# Patient Record
Sex: Female | Born: 1950 | Race: Black or African American | Hispanic: No | Marital: Married | Smoking: Never smoker
Health system: Southern US, Community
[De-identification: ages and names within clinical notes are randomized; demographics above are authoritative.]

## PROBLEM LIST (undated history)

## (undated) DIAGNOSIS — H409 Unspecified glaucoma: Secondary | ICD-10-CM

## (undated) DIAGNOSIS — K219 Gastro-esophageal reflux disease without esophagitis: Secondary | ICD-10-CM

## (undated) DIAGNOSIS — E785 Hyperlipidemia, unspecified: Secondary | ICD-10-CM

## (undated) DIAGNOSIS — D649 Anemia, unspecified: Secondary | ICD-10-CM

## (undated) DIAGNOSIS — E039 Hypothyroidism, unspecified: Secondary | ICD-10-CM

## (undated) DIAGNOSIS — E119 Type 2 diabetes mellitus without complications: Secondary | ICD-10-CM

## (undated) DIAGNOSIS — G4730 Sleep apnea, unspecified: Secondary | ICD-10-CM

## (undated) HISTORY — DX: Hyperlipidemia, unspecified: E78.5

## (undated) HISTORY — DX: Sleep apnea, unspecified: G47.30

## (undated) HISTORY — DX: Anemia, unspecified: D64.9

## (undated) HISTORY — DX: Unspecified glaucoma: H40.9

## (undated) HISTORY — PX: TUBAL LIGATION: SHX77

## (undated) HISTORY — DX: Type 2 diabetes mellitus without complications: E11.9

## (undated) HISTORY — PX: VAGINAL HYSTERECTOMY: SUR661

## (undated) HISTORY — DX: Hypothyroidism, unspecified: E03.9

## (undated) HISTORY — DX: Gastro-esophageal reflux disease without esophagitis: K21.9

---

## 2003-10-24 ENCOUNTER — Ambulatory Visit: Payer: Self-pay

## 2004-11-15 ENCOUNTER — Ambulatory Visit: Payer: Self-pay

## 2005-05-26 ENCOUNTER — Encounter: Payer: Self-pay | Admitting: Physician Assistant

## 2005-06-10 ENCOUNTER — Encounter: Payer: Self-pay | Admitting: Physician Assistant

## 2005-11-28 ENCOUNTER — Ambulatory Visit: Payer: Self-pay

## 2006-12-21 ENCOUNTER — Ambulatory Visit: Payer: Self-pay

## 2009-02-10 ENCOUNTER — Ambulatory Visit: Payer: Self-pay

## 2010-01-20 ENCOUNTER — Ambulatory Visit: Payer: Self-pay

## 2014-10-24 ENCOUNTER — Ambulatory Visit (INDEPENDENT_AMBULATORY_CARE_PROVIDER_SITE_OTHER): Payer: Medicare Other | Admitting: "Endocrinology

## 2014-10-24 ENCOUNTER — Encounter: Payer: Self-pay | Admitting: "Endocrinology

## 2014-10-24 VITALS — BP 146/85 | HR 74 | Ht 64.0 in | Wt 154.0 lb

## 2014-10-24 DIAGNOSIS — E079 Disorder of thyroid, unspecified: Secondary | ICD-10-CM

## 2014-10-24 DIAGNOSIS — E119 Type 2 diabetes mellitus without complications: Secondary | ICD-10-CM

## 2014-10-24 NOTE — Progress Notes (Signed)
HPI  Erin Chen is a 64 y.o.-year-old female, referred by her PCP, Dr.Sabitha Vasiredy, for management of hypothyroidism.  Pt. has been dx with hypothyroidism  3 months ago; she was initiated on levothyroxine 50 g which she states did not tolerate, causing skin rash. He was then switched to Synthroid which also caused skin rash. However when she was taking Synthroid she was also taking 2 other medications including statin. Her last TSH was elevated at 4.6 2-3 weeks after her last thyroid hormone. -She describes weight gain, fatigue, cold intolerance, and constipation. She denies heat intolerance palpitations nor weight loss.  She denies family history of thyroid dysfunction. She denies personal history of goiter. She denies family history of thyroid cancer. She denies history of antithyroid therapy nor radiation to the neck.     ROS: Constitutional: + weight gain/loss, + fatigue, no subjective hyperthermia/hypothermia Eyes: no blurry vision, no xerophthalmia ENT: no sore throat, no nodules palpated in throat, no dysphagia/odynophagia, no hoarseness Cardiovascular: no CP/SOB/palpitations/leg swelling Respiratory: no cough/SOB Gastrointestinal: no N/V/D/C Musculoskeletal: no muscle/joint aches Skin: no rashes Neurological: no tremors/numbness/tingling/dizziness Psychiatric: no depression/anxiety  PE: BP 146/85 mmHg  Pulse 74  Ht 5\' 4"  (1.626 m)  Wt 154 lb (69.854 kg)  BMI 26.42 kg/m2  SpO2 98% Wt Readings from Last 3 Encounters:  10/24/14 154 lb (69.854 kg)   Constitutional: overweight, in NAD Eyes: PERRLA, EOMI, no exophthalmos ENT: moist mucous membranes, no thyromegaly, no cervical lymphadenopathy Cardiovascular: RRR, No MRG Respiratory: CTA B Gastrointestinal: abdomen soft, NT, ND, BS+ Musculoskeletal: no deformities, strength intact in all 4 Skin: moist, warm, no rashes Neurological: no tremor with outstretched hands, DTR normal in all 4   ASSESSMENT: 1.  Hypothyroidism 2. Type 2 diabetes with A1c 6.9%. PLAN:    Patient with hypothyroidism reported intolerance to thyroid hormone.  Her most recent TSH is above target at 4.6. She will need at least partial thyroid hormone supplement. I will obtain thyroid hormone, TSH, and antithyroid antibodies today euthyroid. On physical exam , patient  does not appear to have a goiter, thyroid nodules, or neck compression symptoms. -I have advised her to eat 3 meals a day, avoid unnecessary snacks, and avoid processed carbohydrates including soda, ice cream, and ice tea to help her control type 2 diabetes. She is hesitant to go on medications. -She will return in 1 week for treatment decision.   Marquis LunchGebre Orhan Mayorga, MD Phone: [phone]  Fax: [phone]   10/24/2014, 8:01 PM

## 2014-10-24 NOTE — Patient Instructions (Signed)

## 2014-10-25 LAB — THYROGLOBULIN ANTIBODY: Thyroglobulin Ab: <1 IU/mL (ref <2)

## 2014-10-25 LAB — T4, FREE: Free T4: 0.73 ng/dL — ABNORMAL LOW (ref 0.80–1.80)

## 2014-10-25 LAB — TSH: TSH: 3.011 u[IU]/mL (ref 0.350–4.500)

## 2014-10-25 LAB — THYROID PEROXIDASE ANTIBODY

## 2014-10-29 ENCOUNTER — Ambulatory Visit: Payer: Self-pay | Admitting: "Endocrinology

## 2014-10-31 ENCOUNTER — Ambulatory Visit (INDEPENDENT_AMBULATORY_CARE_PROVIDER_SITE_OTHER): Payer: Medicare Other | Admitting: "Endocrinology

## 2014-10-31 ENCOUNTER — Encounter: Payer: Self-pay | Admitting: "Endocrinology

## 2014-10-31 VITALS — BP 129/81 | HR 79 | Ht 64.0 in | Wt 156.0 lb

## 2014-10-31 DIAGNOSIS — E119 Type 2 diabetes mellitus without complications: Secondary | ICD-10-CM

## 2014-10-31 DIAGNOSIS — E039 Hypothyroidism, unspecified: Secondary | ICD-10-CM | POA: Diagnosis not present

## 2014-10-31 MED ORDER — LEVOTHYROXINE SODIUM 25 MCG PO TABS: 25.0000 ug | ORAL_TABLET | Freq: Every day | ORAL | Status: DC

## 2014-10-31 NOTE — Patient Instructions (Signed)

## 2014-11-01 NOTE — Progress Notes (Signed)
HPI  Erin Chen is a 64 y.o.-year-old female,here to follow up after new set of thyroid function tests. She has hx of hypothyroidism , not on therapy.  Pt. has been dx with hypothyroidism  3 months ago; she was initiated on levothyroxine 50 g which she states did not tolerate, causing skin rash.    -She describes weight gain, fatigue, cold intolerance, and constipation. She denies heat intolerance palpitations nor weight loss.  She denies family history of thyroid dysfunction. She denies personal history of goiter. She denies family history of thyroid cancer. She denies history of antithyroid therapy nor radiation to the neck.     ROS: Constitutional: + weight gain/loss, + fatigue, no subjective hyperthermia/hypothermia Eyes: no blurry vision, no xerophthalmia ENT: no sore throat, no nodules palpated in throat, no dysphagia/odynophagia, no hoarseness Cardiovascular: no CP/SOB/palpitations/leg swelling Respiratory: no cough/SOB Gastrointestinal: no N/V/D/C Musculoskeletal: no muscle/joint aches Skin: no rashes Neurological: no tremors/numbness/tingling/dizziness Psychiatric: no depression/anxiety  PE: BP 129/81 mmHg  Pulse 79  Ht 5\' 4"  (1.626 m)  Wt 156 lb (70.761 kg)  BMI 26.76 kg/m2  SpO2 99% Wt Readings from Last 3 Encounters:  10/31/14 156 lb (70.761 kg)  10/24/14 154 lb (69.854 kg)   Constitutional: overweight, in NAD Eyes: PERRLA, EOMI, no exophthalmos ENT: moist mucous membranes, no thyromegaly, no cervical lymphadenopathy Cardiovascular: RRR, No MRG Respiratory: CTA B Gastrointestinal: abdomen soft, NT, ND, BS+ Musculoskeletal: no deformities, strength intact in all 4 Skin: moist, warm, no rashes Neurological: no tremor with outstretched hands, DTR normal in all 4   Results for Erin GalesJONES, Emika H (MRN [id number]) as of 11/01/2014 03:36  Ref. Range 10/24/2014 15:35  TSH Latest Ref Range: 0.350-4.500 uIU/mL 3.011  Free T4 Latest Ref Range: 0.80-1.80 ng/dL  4.090.73 (L)  Thyroglobulin Ab Latest Ref Range: <2 IU/mL <1  Thyroperoxidase Ab SerPl-aCnc Latest Ref Range: <9 IU/mL >900 (H)    ASSESSMENT: 1. 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 2. Type 2 diabetes with A1c 6.9%.   PLAN:  - options of thyroid hormone replacement were discussed with her. She agrees to try low dose Synthroid for now. I will prescribe 25 mcg po qam, to advance as tolerated.    - We discussed about correct intake of levothyroxine, at fasting, with water, separated by at least 30 minutes from breakfast, and separated by more than 4 hours from calcium, iron, multivitamins, acid reflux medications (PPIs). -Patient is made aware of the fact that thyroid hormone replacement is needed for life, dose to be adjusted by periodic monitoring of thyroid function tests.  Regarding her controlled type 2 DM:  -I have advised her to eat 3 meals a day, avoid unnecessary snacks, and avoid processed carbohydrates including soda, ice cream, and ice tea to help her control type 2 diabetes. She is hesitant to go on medications.  -She will return in 12 weeks with repeat labs for reevaluation.  Marquis LunchGebre Rakia Frayne, MD Phone: [phone]  Fax: [phone]   11/01/2014, 3:34 AM

## 2014-12-01 ENCOUNTER — Telehealth: Payer: Self-pay

## 2014-12-01 ENCOUNTER — Other Ambulatory Visit: Payer: Self-pay | Admitting: "Endocrinology

## 2014-12-01 MED ORDER — LEVOTHYROXINE SODIUM 13 MCG PO CAPS: 13.0000 ug | ORAL_CAPSULE | Freq: Every day | ORAL | Status: DC

## 2014-12-01 NOTE — Telephone Encounter (Signed)
Her next option is Tirosint , which may be more expensive than Synthroid. I will send in a prescription for her to replace that Synthroid with.

## 2014-12-01 NOTE — Telephone Encounter (Signed)
Pt states that the synthroid is still causing her to itch. She states she discussed this with Dr Fransico HimNida at her last visit and wants to know what else she can take.

## 2014-12-02 ENCOUNTER — Other Ambulatory Visit: Payer: Self-pay

## 2014-12-02 NOTE — Telephone Encounter (Signed)
Pt notified and agrees. 

## 2014-12-02 NOTE — Telephone Encounter (Signed)
Pt states she will call her PC about that but she is still having itching with the Levothyroxine and wants to know if she should continue on this medication.

## 2015-01-29 ENCOUNTER — Other Ambulatory Visit: Payer: Self-pay | Admitting: "Endocrinology

## 2015-01-30 LAB — HEMOGLOBIN A1C
HEMOGLOBIN A1C: 6.9 % — AB (ref <5.7)
Mean Plasma Glucose: 151 mg/dL — ABNORMAL HIGH (ref <117)

## 2015-01-30 LAB — T4, FREE: FREE T4: 0.81 ng/dL (ref 0.80–1.80)

## 2015-01-30 LAB — TSH: TSH: 2.854 u[IU]/mL (ref 0.350–4.500)

## 2015-02-05 ENCOUNTER — Ambulatory Visit: Payer: Medicare Other | Admitting: "Endocrinology

## 2015-02-06 ENCOUNTER — Ambulatory Visit (INDEPENDENT_AMBULATORY_CARE_PROVIDER_SITE_OTHER): Payer: Medicare Other | Admitting: "Endocrinology

## 2015-02-06 ENCOUNTER — Encounter: Payer: Self-pay | Admitting: "Endocrinology

## 2015-02-06 VITALS — BP 135/82 | HR 64 | Ht 64.0 in | Wt 154.0 lb

## 2015-02-06 DIAGNOSIS — E119 Type 2 diabetes mellitus without complications: Secondary | ICD-10-CM

## 2015-02-06 DIAGNOSIS — E785 Hyperlipidemia, unspecified: Secondary | ICD-10-CM

## 2015-02-06 DIAGNOSIS — E039 Hypothyroidism, unspecified: Secondary | ICD-10-CM | POA: Diagnosis not present

## 2015-02-06 DIAGNOSIS — E782 Mixed hyperlipidemia: Secondary | ICD-10-CM | POA: Insufficient documentation

## 2015-02-06 MED ORDER — LEVOTHYROXINE SODIUM 25 MCG PO CAPS: 1.0000 ug | ORAL_CAPSULE | Freq: Every day | ORAL | Status: DC

## 2015-02-06 NOTE — Progress Notes (Signed)
HPI  Erin Chen is a 65 y.o.-year-old female,here to follow up after new set of thyroid function tests. She has hx of hypothyroidism ,  on therapy on 13 g of Tirosint due to intolerance to levothyroxine and Synthroid. -She has tolerated this medication better. Pt. has been dx with hypothyroidism  6 months ago. -She has no new complaints. -She has dealt with weight gain, fatigue, cold intolerance, and constipation. She denies heat intolerance palpitations nor weight loss.  She denies family history of thyroid dysfunction. She denies personal history of goiter. She denies family history of thyroid cancer. She denies history of antithyroid therapy nor radiation to the neck.     Medication List       This list is accurate as of: 02/06/15  3:09 PM.  Always use your most recent med list.               Fish Oil 1000 MG Caps  Take 2,000 mg by mouth daily.     Levothyroxine Sodium 25 MCG Caps  Take 0.04 capsules (1 mcg total) by mouth daily before breakfast.     simvastatin 20 MG tablet  Commonly known as:  ZOCOR  Take 20 mg by mouth daily.     Vitamin D3 2000 units Tabs  Take by mouth daily.        ROS: Constitutional: + weight gain/loss, + fatigue, no subjective hyperthermia/hypothermia Eyes: no blurry vision, no xerophthalmia ENT: no sore throat, no nodules palpated in throat, no dysphagia/odynophagia, no hoarseness Cardiovascular: no CP/SOB/palpitations/leg swelling Respiratory: no cough/SOB Gastrointestinal: no N/V/D/C Musculoskeletal: no muscle/joint aches Skin: no rashes Neurological: no tremors/numbness/tingling/dizziness Psychiatric: no depression/anxiety  PE: BP 135/82 mmHg  Pulse 64  Ht  (1.626 m)  Wt 154 lb (69.854 kg)  BMI 26.42 kg/m2  SpO2 98% Wt Readings from Last 3 Encounters:  02/06/15 154 lb (69.854 kg)  10/31/14 156 lb (70.761 kg)  10/24/14 154 lb (69.854 kg)   Constitutional: overweight, in NAD Eyes: PERRLA, EOMI, no exophthalmos ENT:  moist mucous membranes, no thyromegaly, no cervical lymphadenopathy Cardiovascular: RRR, No MRG Respiratory: CTA B Gastrointestinal: abdomen soft, NT, ND, BS+ Musculoskeletal: no deformities, strength intact in all 4 Skin: moist, warm, no rashes Neurological: no tremor with outstretched hands, DTR normal in all 4   Recent Results (from the past 2160 hour(s))  TSH     Status: None   Collection Time: 01/29/15  1:38 PM  Result Value Ref Range   TSH 2.854 0.350 - 4.500 uIU/mL  T4, free     Status: None   Collection Time: 01/29/15  1:38 PM  Result Value Ref Range   Free T4 0.81 0.80 - 1.80 ng/dL  Hemoglobin W1X     Status: Abnormal   Collection Time: 01/29/15  1:38 PM  Result Value Ref Range   Hgb A1c MFr Bld 6.9 (H) <5.7 %    Comment:                                                                        According to the ADA Clinical Practice Recommendations for 2011, when HbA1c is used as a screening test:     >=6.5%   Diagnostic of Diabetes Mellitus            (  if abnormal result is confirmed)   5.7-6.4%   Increased risk of developing Diabetes Mellitus   References:Diagnosis and Classification of Diabetes Mellitus,Diabetes Care,2011,34(Suppl 1):S62-S69 and Standards of Medical Care in         Diabetes - 2011,Diabetes Care,2011,34 (Suppl 1):S11-S61.      Mean Plasma Glucose 151 (H) <117 mg/dL     ASSESSMENT: 1. 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 2. Type 2 diabetes with A1c 6.9%. 3. Hyperlipidemia 4. Vitamin D deficiency  PLAN:  -She will benefit from slight increase in her Tirosint. I would increase to 25 g by mouth every morning.   - We discussed about correct intake of levothyroxine, at fasting, with water, separated by at least 30 minutes from breakfast, and separated by more than 4 hours from calcium, iron, multivitamins, acid reflux medications (PPIs). -Patient is made aware of the fact that thyroid hormone replacement is needed for life, dose to be  adjusted by periodic monitoring of thyroid function tests.  Regarding her controlled type 2 DM:  -I have advised her to eat 3 meals a day, avoid unnecessary snacks, and avoid processed carbohydrates including soda, ice cream, and ice tea to help her control type 2 diabetes. She is hesitant to go on medications. A1c 6.9%.   -I advised her to continue simvastatin 20 mg by mouth daily at bedtime. -I advised her to continue vitamin D 2000 units daily. -She will return in 16 weeks with repeat labs for reevaluation.  [name], MD Phone: [phone]  Fax: [phone]   02/06/2015, 3:04 PM

## 2015-03-21 ENCOUNTER — Other Ambulatory Visit: Payer: Self-pay | Admitting: "Endocrinology

## 2015-06-11 ENCOUNTER — Encounter: Payer: Self-pay | Admitting: "Endocrinology

## 2015-06-11 ENCOUNTER — Ambulatory Visit (INDEPENDENT_AMBULATORY_CARE_PROVIDER_SITE_OTHER): Payer: Medicare Other | Admitting: "Endocrinology

## 2015-06-11 VITALS — BP 127/77 | HR 74 | Ht 64.0 in | Wt 152.0 lb

## 2015-06-11 DIAGNOSIS — E785 Hyperlipidemia, unspecified: Secondary | ICD-10-CM

## 2015-06-11 DIAGNOSIS — E119 Type 2 diabetes mellitus without complications: Secondary | ICD-10-CM | POA: Diagnosis not present

## 2015-06-11 DIAGNOSIS — E039 Hypothyroidism, unspecified: Secondary | ICD-10-CM

## 2015-06-11 NOTE — Progress Notes (Signed)
HPI  Erin Chen is a 65 y.o.-year-old female,here to follow up for hypothyroidism and type 2 DM.  She has hx of hypothyroidism ,  on therapy on 25 g of Tirosint due to intolerance to levothyroxine and Synthroid. -She has tolerated this medication better. -She has no new complaints. -She has steady weight, mild fatigue. She denies cold intolerance/ constipation. She denies heat intolerance palpitations nor weight loss.  She denies family history of thyroid dysfunction. She denies personal history of goiter. She denies family history of thyroid cancer. She denies history of antithyroid therapy nor radiation to the neck.     Medication List       This list is accurate as of: 06/11/15  1:55 PM.  Always use your most recent med list.               Fish Oil 1000 MG Caps  Take 2,000 mg by mouth daily.     Levothyroxine Sodium 25 MCG Caps  Take 0.04 capsules (1 mcg total) by mouth daily before breakfast.     simvastatin 20 MG tablet  Commonly known as:  ZOCOR  Take 20 mg by mouth daily.     Vitamin D3 2000 units Tabs  Take by mouth daily.        ROS: Constitutional: steady weight  + fatigue, no subjective hyperthermia/hypothermia Eyes: no blurry vision, no xerophthalmia ENT: no sore throat, no nodules palpated in throat, no dysphagia/odynophagia, no hoarseness Cardiovascular: no CP/SOB/palpitations/leg swelling Respiratory: no cough/SOB Gastrointestinal: no N/V/D/C Musculoskeletal: no muscle/joint aches Skin: no rashes Neurological: no tremors/numbness/tingling/dizziness Psychiatric: no depression/anxiety  PE: BP 127/77 mmHg  Pulse 74  Ht 5\' 4"  (1.626 m)  Wt 152 lb (68.947 kg)  BMI 26.08 kg/m2  SpO2 95% Wt Readings from Last 3 Encounters:  06/11/15 152 lb (68.947 kg)  02/06/15 154 lb (69.854 kg)  10/31/14 156 lb (70.761 kg)   Constitutional:  in NAD Eyes: PERRLA, EOMI, no exophthalmos ENT: moist mucous membranes, no thyromegaly, no cervical  lymphadenopathy Cardiovascular: RRR, No MRG Respiratory: CTA B Gastrointestinal: abdomen soft, NT, ND, BS+ Musculoskeletal: no deformities, strength intact in all 4 Skin: moist, warm, no rashes Neurological: no tremor with outstretched hands, DTR normal in all 4    Erin recent labs did not include thyroid function test.  Erin A1c is better at 6.6%   ASSESSMENT: 1. 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 2. Type 2 diabetes with A1c 6.6% improving from 6.9%. 3. Hyperlipidemia 4. Vitamin D deficiency  PLAN:  -She will continue on  Tirosint 25 g by mouth every morning.   - We discussed about correct intake of levothyroxine, at fasting, with water, separated by at least 30 minutes from breakfast, and separated by more than 4 hours from calcium, iron, multivitamins, acid reflux medications (PPIs). -Patient is made aware of the fact that thyroid hormone replacement is needed for life, dose to be adjusted by periodic monitoring of thyroid function tests.  Regarding Erin controlled type 2 DM:  -I have advised Erin to eat 3 meals a day, avoid unnecessary snacks, and avoid processed carbohydrates including soda, ice cream, and ice tea to help Erin control type 2 diabetes. She is hesitant to go on medications. A1c 6.6%.   -I advised Erin to continue simvastatin 20 mg by mouth daily at bedtime. -I advised Erin to continue vitamin D 2000 units daily. -She will return in 16 weeks with repeat labs for reevaluation.  Erin LunchGebre Erin Bernier, MD Phone: [phone]  Fax: [phone]   06/11/2015,  1:55 PM

## 2015-07-01 ENCOUNTER — Other Ambulatory Visit: Payer: Self-pay | Admitting: "Endocrinology

## 2015-08-14 ENCOUNTER — Other Ambulatory Visit: Payer: Self-pay | Admitting: "Endocrinology

## 2015-08-20 ENCOUNTER — Other Ambulatory Visit: Payer: Self-pay

## 2015-08-20 ENCOUNTER — Other Ambulatory Visit: Payer: Self-pay | Admitting: "Endocrinology

## 2015-08-20 MED ORDER — LEVOTHYROXINE SODIUM 25 MCG PO CAPS: ORAL_CAPSULE | ORAL | 2 refills | Status: DC

## 2015-11-18 ENCOUNTER — Other Ambulatory Visit: Payer: Self-pay | Admitting: "Endocrinology

## 2015-12-17 ENCOUNTER — Ambulatory Visit (INDEPENDENT_AMBULATORY_CARE_PROVIDER_SITE_OTHER): Payer: Medicare Other | Admitting: "Endocrinology

## 2015-12-17 ENCOUNTER — Encounter: Payer: Self-pay | Admitting: "Endocrinology

## 2015-12-17 VITALS — BP 127/80 | HR 82 | Ht 64.0 in | Wt 153.0 lb

## 2015-12-17 DIAGNOSIS — E782 Mixed hyperlipidemia: Secondary | ICD-10-CM | POA: Diagnosis not present

## 2015-12-17 DIAGNOSIS — E039 Hypothyroidism, unspecified: Secondary | ICD-10-CM

## 2015-12-17 DIAGNOSIS — E119 Type 2 diabetes mellitus without complications: Secondary | ICD-10-CM

## 2015-12-17 MED ORDER — TIROSINT 50 MCG PO CAPS: 50.0000 ug | ORAL_CAPSULE | Freq: Every day | ORAL | 6 refills | Status: DC

## 2015-12-17 NOTE — Progress Notes (Signed)
HPI  Erin Chen is a 65 y.o.-year-old female,here to follow up for hypothyroidism and type 2 DM.  She has hx of hypothyroidism ,  on therapy on 25 g of Tirosint due to intolerance to levothyroxine and Synthroid. -She has tolerated this medication better. -She has no new complaints. -She has steady weight, mild fatigue. She denies cold intolerance/ constipation. She denies heat intolerance palpitations nor weight loss.  She denies family history of thyroid dysfunction. She denies personal history of goiter. She denies family history of thyroid cancer. She denies history of antithyroid therapy nor radiation to the neck.     Medication List       Accurate as of 12/17/15  1:48 PM. Always use your most recent med list.          Fish Oil 1000 MG Caps Take 2,000 mg by mouth daily.   simvastatin 20 MG tablet Commonly known as:  ZOCOR Take 20 mg by mouth daily.   TIROSINT 50 MCG Caps Generic drug:  Levothyroxine Sodium Take 1 capsule (50 mcg total) by mouth daily before breakfast.   Vitamin D3 2000 units Tabs Take by mouth daily.       ROS: Constitutional: steady weight  + fatigue, no subjective hyperthermia/hypothermia Eyes: no blurry vision, no xerophthalmia ENT: no sore throat, no nodules palpated in throat, no dysphagia/odynophagia, no hoarseness Cardiovascular: no CP/SOB/palpitations/leg swelling Respiratory: no cough/SOB Gastrointestinal: no N/V/D/C Musculoskeletal: no muscle/joint aches Skin: no rashes Neurological: no tremors/numbness/tingling/dizziness Psychiatric: no depression/anxiety  PE: BP 127/80   Pulse 82   Ht 5\' 4"  (1.626 m)   Wt 153 lb (69.4 kg)   BMI 26.26 kg/m  Wt Readings from Last 3 Encounters:  12/17/15 153 lb (69.4 kg)  06/11/15 152 lb (68.9 kg)  02/06/15 154 lb (69.9 kg)   Constitutional:  in NAD Eyes: PERRLA, EOMI, no exophthalmos ENT: moist mucous membranes, no thyromegaly, no cervical lymphadenopathy Cardiovascular: RRR, No  MRG Respiratory: CTA B Gastrointestinal: abdomen soft, NT, ND, BS+ Musculoskeletal: no deformities, strength intact in all 4 Skin: moist, warm, no rashes Neurological: no tremor with outstretched hands, DTR normal in all 4    her recent labs did not include thyroid function test.  Her A1c is better at 6.6%   Her labs from 12/09/2015 showed free T4 low at 0.67 and TSH above target 7.31. Her repeat A1c is 6.6%.  ASSESSMENT: 1. 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 2. Type 2 diabetes with A1c 6.6% improving from 6.9%. 3. Hyperlipidemia 4. Vitamin D deficiency  PLAN:  -She will benefit from slight increase in her Tirosint, I will prescribe 25 g by mouth every morning.   - We discussed about correct intake of levothyroxine, at fasting, with water, separated by at least 30 minutes from breakfast, and separated by more than 4 hours from calcium, iron, multivitamins, acid reflux medications (PPIs). -Patient is made aware of the fact that thyroid hormone replacement is needed for life, dose to be adjusted by periodic monitoring of thyroid function tests.  Regarding her controlled type 2 DM:  -I have advised her to eat 3 meals a day, avoid unnecessary snacks, and avoid processed carbohydrates including soda, ice cream, and ice tea to help her control type 2 diabetes. She is hesitant to go on medications. A1c 6.6%.   -I advised her to continue simvastatin 20 mg by mouth daily at bedtime. Her LDL has improved to 60. -I advised her to continue vitamin D 2000 units daily. -She will return in 16 weeks  with repeat labs for reevaluation.  Marquis LunchGebre Mouhamadou Gittleman, MD Phone: [phone]  Fax: [phone]   12/17/2015, 1:48 PM

## 2016-06-16 ENCOUNTER — Ambulatory Visit (INDEPENDENT_AMBULATORY_CARE_PROVIDER_SITE_OTHER): Payer: Medicare Other | Admitting: "Endocrinology

## 2016-06-16 ENCOUNTER — Encounter: Payer: Self-pay | Admitting: "Endocrinology

## 2016-06-16 VITALS — BP 118/70 | HR 86 | Ht 64.0 in | Wt 153.0 lb

## 2016-06-16 DIAGNOSIS — E039 Hypothyroidism, unspecified: Secondary | ICD-10-CM | POA: Diagnosis not present

## 2016-06-16 DIAGNOSIS — E782 Mixed hyperlipidemia: Secondary | ICD-10-CM | POA: Diagnosis not present

## 2016-06-16 DIAGNOSIS — E119 Type 2 diabetes mellitus without complications: Secondary | ICD-10-CM | POA: Diagnosis not present

## 2016-06-16 MED ORDER — LEVOTHYROXINE SODIUM 75 MCG PO CAPS: 75.0000 ug | ORAL_CAPSULE | Freq: Every day | ORAL | 6 refills | Status: DC

## 2016-06-16 NOTE — Progress Notes (Signed)
HPI  Erin Chen is a 66 y.o.-year-old female,here to follow up for hypothyroidism and type 2 DM.  She has hx of hypothyroidism ,  on therapy on 50 g of Tirosint due to intolerance to levothyroxine and Synthroid. -She has tolerated this medication better. -She has no new complaints. -She has steady weight, mild fatigue. She denies cold intolerance/ constipation. She denies heat intolerance palpitations nor weight loss.  She denies family history of thyroid dysfunction. She denies personal history of goiter. She denies family history of thyroid cancer. She denies history of antithyroid therapy nor radiation to the neck.   Allergies as of 06/16/2016      Reactions   Levothyroxine    Synthroid [levothyroxine Sodium]       Medication List       Accurate as of 06/16/16  2:03 PM. Always use your most recent med list.          Fish Oil 1000 MG Caps Take 2,000 mg by mouth daily.   Levothyroxine Sodium 75 MCG Caps Commonly known as:  TIROSINT Take 1 capsule (75 mcg total) by mouth daily before breakfast.   simvastatin 20 MG tablet Commonly known as:  ZOCOR Take 20 mg by mouth daily.   Vitamin D3 2000 units Tabs Take by mouth daily.       ROS: Constitutional: steady weight  + fatigue, no subjective hyperthermia/hypothermia Eyes: no blurry vision, no xerophthalmia ENT: no sore throat, no nodules palpated in throat, no dysphagia/odynophagia, no hoarseness Cardiovascular: no CP/SOB/palpitations/leg swelling Respiratory: no cough/SOB Gastrointestinal: no N/V/D/C Musculoskeletal: no muscle/joint aches Skin: no rashes Neurological: no tremors/numbness/tingling/dizziness Psychiatric: no depression/anxiety  PE: BP 118/70   Pulse 86   Ht 5\' 4"  (1.626 m)   Wt 153 lb (69.4 kg)   BMI 26.26 kg/m  Wt Readings from Last 3 Encounters:  06/16/16 153 lb (69.4 kg)  12/17/15 153 lb (69.4 kg)  06/11/15 152 lb (68.9 kg)   Constitutional:  in NAD Eyes: PERRLA, EOMI, no  exophthalmos ENT: moist mucous membranes, no thyromegaly, no cervical lymphadenopathy Cardiovascular: RRR, No MRG Respiratory: CTA B Gastrointestinal: abdomen soft, NT, ND, BS+ Musculoskeletal: no deformities, strength intact in all 4 Skin: moist, warm, no rashes Neurological: no tremor with outstretched hands, DTR normal in all 4    her recent labs did not include thyroid function test.  Her A1c is better at 6.6%   Her labs from 12/09/2015 showed free T4 low at 0.67 and TSH above target 7.31. Her repeat A1c is 6.6%.   Her labs from the 06/10/2016 showed free T4 0.81 (normal 0.7 6-1.46), TSH 3.89 elevated (normal 0.3 5-3.74)   ASSESSMENT: 1. 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 2. Type 2 diabetes with A1c 6.6% improving from 6.9%. 3. Hyperlipidemia 4. Vitamin D deficiency  PLAN:  -She will benefit from slight increase in her Tirosint, I will prescribe  Tirosint 75 g by mouth every morning.   - We discussed about correct intake of levothyroxine, at fasting, with water, separated by at least 30 minutes from breakfast, and separated by more than 4 hours from calcium, iron, multivitamins, acid reflux medications (PPIs). -Patient is made aware of the fact that thyroid hormone replacement is needed for life, dose to be adjusted by periodic monitoring of thyroid function tests.  Regarding her controlled type 2 DM:  -I have advised her to eat 3 meals a day, avoid unnecessary snacks, and avoid processed carbohydrates including soda, ice cream, and ice tea to help her control type 2  diabetes. She is hesitant to go on medications. A1c 6.6%.   -I advised her to continue simvastatin 20 mg by mouth daily at bedtime. Her LDL has improved to 60. -I advised her to continue vitamin D 2000 units daily. - She will return in 6 months with labs for reevaluation.  Marquis LunchGebre Jadriel Saxer, MD Phone: [phone]  Fax: [phone]   06/16/2016, 2:03 PM

## 2016-12-13 LAB — TSH: TSH: 1.32 (ref <=5.90)

## 2016-12-13 LAB — HEMOGLOBIN A1C: HEMOGLOBIN A1C: 7.1

## 2016-12-19 ENCOUNTER — Ambulatory Visit: Payer: Medicare Other | Admitting: "Endocrinology

## 2016-12-21 ENCOUNTER — Ambulatory Visit (INDEPENDENT_AMBULATORY_CARE_PROVIDER_SITE_OTHER): Payer: Medicare Other | Admitting: "Endocrinology

## 2016-12-21 ENCOUNTER — Encounter: Payer: Self-pay | Admitting: "Endocrinology

## 2016-12-21 VITALS — BP 129/79 | HR 79 | Ht 64.0 in | Wt 156.0 lb

## 2016-12-21 DIAGNOSIS — E559 Vitamin D deficiency, unspecified: Secondary | ICD-10-CM

## 2016-12-21 DIAGNOSIS — E039 Hypothyroidism, unspecified: Secondary | ICD-10-CM

## 2016-12-21 DIAGNOSIS — E119 Type 2 diabetes mellitus without complications: Secondary | ICD-10-CM

## 2016-12-21 DIAGNOSIS — E782 Mixed hyperlipidemia: Secondary | ICD-10-CM | POA: Diagnosis not present

## 2016-12-21 NOTE — Progress Notes (Signed)
HPI  Erin Chen is a 66 y.o.-year-old female,here to follow up for hypothyroidism and type 2 DM.  She has hx of hypothyroidism ,  on therapy on 75 g of Tirosint due to intolerance to levothyroxine and Synthroid. -She has tolerated this medication better. -She has no new complaints. -She has steady weight, mild fatigue. She denies cold intolerance/ constipation. She denies heat intolerance palpitations nor weight loss.  She denies family history of thyroid dysfunction. She denies personal history of goiter. She denies family history of thyroid cancer. She denies history of antithyroid therapy nor radiation to the neck.   Allergies as of 12/21/2016      Reactions   Levothyroxine    Synthroid [levothyroxine Sodium]       Medication List        Accurate as of 12/21/16  3:11 PM. Always use your most recent med list.          Fish Oil 1000 MG Caps Take 2,000 mg by mouth daily.   Levothyroxine Sodium 75 MCG Caps Commonly known as:  TIROSINT Take 1 capsule (75 mcg total) by mouth daily before breakfast.   simvastatin 20 MG tablet Commonly known as:  ZOCOR Take 20 mg by mouth daily.   Vitamin D3 2000 units Tabs Take by mouth daily.       ROS: Constitutional: steady weight ,  + fatigue, no subjective hyperthermia/hypothermia Eyes: no blurry vision, no xerophthalmia ENT: no sore throat, no nodules palpated in throat, no dysphagia/odynophagia, no hoarseness Cardiovascular: no CP/SOB/palpitations/leg swelling Respiratory: no cough/SOB Gastrointestinal: no N/V/D/C Musculoskeletal: no muscle/joint aches Skin: no rashes Neurological: no tremors/numbness/tingling/dizziness Psychiatric: no depression/anxiety  PE: BP 129/79   Pulse 79   Ht 5\' 4"  (1.626 m)   Wt 156 lb (70.8 kg)   BMI 26.78 kg/m  Wt Readings from Last 3 Encounters:  12/21/16 156 lb (70.8 kg)  06/16/16 153 lb (69.4 kg)  12/17/15 153 lb (69.4 kg)   Constitutional:  in NAD Eyes: PERRLA, EOMI, no  exophthalmos ENT: moist mucous membranes, no thyromegaly, no cervical lymphadenopathy Cardiovascular: RRR, No MRG Respiratory: CTA B Gastrointestinal: abdomen soft, NT, ND, BS+ Musculoskeletal: no deformities, strength intact in all 4 Skin: moist, warm, no rashes Neurological: no tremor with outstretched hands, DTR normal in all 4  Recent Results (from the past 2160 hour(s))  Hemoglobin A1c     Status: None   Collection Time: 12/13/16 12:00 AM  Result Value Ref Range   Hemoglobin A1C 7.1   TSH     Status: None   Collection Time: 12/13/16 12:00 AM  Result Value Ref Range   TSH 1.32 0.41 - 5.90    Comment: FREE T4 0.97    - RECENT LABS SHOWING A1C OF 7.1% INCREASING FROM 6.6%.  ASSESSMENT: 1. 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 2. Type 2 diabetes with A1c 6.6% improving from 6.9%. 3. Hyperlipidemia 4. Vitamin D deficiency  PLAN:  -  Her thyroid function tests are consistent with appropriate replacement. I advised her to continue  Tirosint 75 g by mouth every morning.   - We discussed about correct intake of levothyroxine, at fasting, with water, separated by at least 30 minutes from breakfast, and separated by more than 4 hours from calcium, iron, multivitamins, acid reflux medications (PPIs). -Patient is made aware of the fact that thyroid hormone replacement is needed for life, dose to be adjusted by periodic monitoring of thyroid function tests.  Regarding her controlled type 2 DM:  - Given her increasing  A1c of 7.1% from 6.6%, I approached her for therapy with low-dose metformin along with a dietitian in exercise. However she declined the metformin prescription. She promises to do better on her diet.  -  Suggestion is made for her to avoid simple carbohydrates  from her diet including Cakes, Sweet Desserts / Pastries, Ice Cream, Soda (diet and regular), Sweet Tea, Candies, Chips, Cookies, Store Bought Juices, Alcohol in Excess of  1-2 drinks a day, Artificial  Sweeteners, and "Sugar-free" Products. This will help patient to have stable blood glucose profile and potentially avoid unintended weight gain.   -I advised her to continue simvastatin 20 mg by mouth daily at bedtime. Her LDL has improved to 60. -I advised her to continue vitamin D 2000 units daily. - She will return in 3 months with labs for reevaluation.   Marquis LunchGebre Angelika Jerrett, MD [facility] [address] [place], [place] [address] Ph: [phone] F: [phone]  -  This note was partially dictated with voice recognition software. Similar sounding words can be transcribed inadequately or may not  be corrected upon review.  12/21/2016, 3:11 PM

## 2017-03-27 ENCOUNTER — Ambulatory Visit: Payer: Medicare Other | Admitting: "Endocrinology

## 2017-04-06 ENCOUNTER — Encounter: Payer: Self-pay | Admitting: "Endocrinology

## 2017-04-13 ENCOUNTER — Ambulatory Visit (INDEPENDENT_AMBULATORY_CARE_PROVIDER_SITE_OTHER): Payer: Medicare Other | Admitting: "Endocrinology

## 2017-04-13 ENCOUNTER — Encounter: Payer: Self-pay | Admitting: "Endocrinology

## 2017-04-13 VITALS — BP 121/81 | HR 80 | Ht 64.0 in | Wt 149.0 lb

## 2017-04-13 DIAGNOSIS — E039 Hypothyroidism, unspecified: Secondary | ICD-10-CM

## 2017-04-13 DIAGNOSIS — E119 Type 2 diabetes mellitus without complications: Secondary | ICD-10-CM

## 2017-04-13 MED ORDER — LEVOTHYROXINE SODIUM 75 MCG PO CAPS: 75.0000 ug | ORAL_CAPSULE | Freq: Every day | ORAL | 12 refills | Status: DC

## 2017-04-13 NOTE — Progress Notes (Signed)
HPI  Erin Chen is a 67 y.o.-year-old female,here to follow up for hypothyroidism and type 2 DM.  She has hx of hypothyroidism ,  on therapy on 75 g of Tirosint due to intolerance to levothyroxine and Synthroid. -She has tolerated this medication better. -She lost 7 pounds intentionally.  She has no new complaints today.  She denies cold intolerance, constipation. -She denies palpitations, tremors, heat intolerance.     She denies family history of thyroid dysfunction. She denies personal history of goiter. She denies family history of thyroid cancer. She denies history of antithyroid therapy nor radiation to the neck.   Allergies as of 04/13/2017      Reactions   Levothyroxine    Synthroid [levothyroxine Sodium]       Medication List        Accurate as of 04/13/17  4:01 PM. Always use your most recent med list.          Fish Oil 1000 MG Caps Take 2,000 mg by mouth daily.   Levothyroxine Sodium 75 MCG Caps Commonly known as:  TIROSINT Take 1 capsule (75 mcg total) by mouth daily before breakfast.   simvastatin 20 MG tablet Commonly known as:  ZOCOR Take 20 mg by mouth daily.   Vitamin D3 2000 units Tabs Take by mouth daily.       ROS: Constitutional: + Weight loss,   + fatigue, no subjective hyperthermia/hypothermia Eyes: no blurry vision, no xerophthalmia ENT: no sore throat, no nodules palpated in throat, no dysphagia/odynophagia, no hoarseness Cardiovascular: no chest pain, shortness of breath, palpitations.    Respiratory: no cough/SOB Gastrointestinal: no N/V/D/C Musculoskeletal: no muscle/joint aches Skin: no rashes Neurological: No tremors, no tingling.   Psychiatric: no depression/anxiety  PE: BP 121/81   Pulse 80   Ht 5\' 4"  (1.626 m)   Wt 149 lb (67.6 kg)   BMI 25.58 kg/m  Wt Readings from Last 3 Encounters:  04/13/17 149 lb (67.6 kg)  12/21/16 156 lb (70.8 kg)  06/16/16 153 lb (69.4 kg)   Constitutional: Alert and oriented x3, not in  acute distress.   Eyes: PERRLA, EOMI, no exophthalmos ENT: moist mucous membranes, no thyromegaly, no cervical lymphadenopathy Musculoskeletal: no deformities, strength intact in all 4 Skin: moist, warm, no rashes Neurological: Tremors of outstretched hands.    April 06, 2017 labs showed: Free T4 within normal limits at 1.4 2, TSH within normal limits 0.4 - RECENT LABS SHOWING A1C OF 7.1% INCREASING FROM 6.6%.  ASSESSMENT: 1. 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 2. Type 2 diabetes with A1c 6.6% improving from 6.9%.  PLAN:  -  Her thyroid function tests are consistent with appropriate replacement. -I advised her to continue  Tirosint 75 g by mouth every morning.   - We discussed about correct intake of levothyroxine, at fasting, with water, separated by at least 30 minutes from breakfast, and separated by more than 4 hours from calcium, iron, multivitamins, acid reflux medications (PPIs). -Patient is made aware of the fact that thyroid hormone replacement is needed for life, dose to be adjusted by periodic monitoring of thyroid function tests.  Regarding her controlled type 2 DM:  - Given her increasing A1c of 7.1% from 6.6%, I approached her for therapy with low-dose metformin along with a dietitian in exercise. However she declined the metformin prescription. She promises to do better on her diet.  -  Suggestion is made for her to avoid simple carbohydrates  from her diet including Cakes, Sweet Desserts /  Pastries, Ice Cream, Soda (diet and regular), Sweet Tea, Candies, Chips, Cookies, Store Bought Juices, Alcohol in Excess of  1-2 drinks a day, Artificial Sweeteners, and "Sugar-free" Products. This will help patient to have stable blood glucose profile and potentially avoid unintended weight gain.  Erin Chen participated in the discussions, expressed understanding, and voiced agreement with the above plans.  All questions were answered to her satisfaction. she is encouraged  to contact clinic should she have any questions or concerns prior to her return visit.    Erin LunchGebre Cass Vandermeulen, MD [facility] [address] [place], [place] [address] Ph: [phone] F: [phone]  -  This note was partially dictated with voice recognition software. Similar sounding words can be transcribed inadequately or may not  be corrected upon review.  04/13/2017, 4:01 PM

## 2017-05-04 ENCOUNTER — Other Ambulatory Visit (HOSPITAL_COMMUNITY): Payer: Self-pay | Admitting: Internal Medicine

## 2017-05-04 DIAGNOSIS — R945 Abnormal results of liver function studies: Secondary | ICD-10-CM

## 2017-05-04 DIAGNOSIS — Z1231 Encounter for screening mammogram for malignant neoplasm of breast: Secondary | ICD-10-CM

## 2017-05-04 DIAGNOSIS — R7989 Other specified abnormal findings of blood chemistry: Secondary | ICD-10-CM

## 2017-05-22 ENCOUNTER — Ambulatory Visit (HOSPITAL_COMMUNITY)
Admission: RE | Admit: 2017-05-22 | Discharge: 2017-05-22 | Disposition: A | Discharge status: Home / Self Care | Payer: Medicare Other | Source: Ambulatory Visit | Attending: Internal Medicine | Admitting: Internal Medicine

## 2017-05-22 ENCOUNTER — Encounter (HOSPITAL_COMMUNITY): Payer: Self-pay | Admitting: Radiology

## 2017-05-22 DIAGNOSIS — N289 Disorder of kidney and ureter, unspecified: Secondary | ICD-10-CM | POA: Diagnosis not present

## 2017-05-22 DIAGNOSIS — R945 Abnormal results of liver function studies: Secondary | ICD-10-CM

## 2017-05-22 DIAGNOSIS — R7989 Other specified abnormal findings of blood chemistry: Secondary | ICD-10-CM | POA: Diagnosis not present

## 2017-05-22 DIAGNOSIS — Z1231 Encounter for screening mammogram for malignant neoplasm of breast: Secondary | ICD-10-CM

## 2017-05-22 DIAGNOSIS — I700 Atherosclerosis of aorta: Secondary | ICD-10-CM | POA: Diagnosis not present

## 2017-05-22 LAB — POCT I-STAT CREATININE: Creatinine, Ser: 0.8 mg/dL (ref 0.44–1.00)

## 2017-05-22 MED ORDER — IOPAMIDOL (ISOVUE-300) INJECTION 61%
100.0000 mL | Freq: Once | INTRAVENOUS | Status: AC | PRN
  Administered 2017-05-22: 100 mL via INTRAVENOUS

## 2018-04-16 LAB — TSH: TSH: 3.25 (ref 0.41–5.90)

## 2018-04-16 LAB — LIPID PANEL
Cholesterol: 182 (ref 0–200)
HDL: 91 — AB (ref 35–70)
LDL Cholesterol: 78
Triglycerides: 45 (ref 40–160)

## 2018-04-16 LAB — HEMOGLOBIN A1C: Hemoglobin A1C: 7.1

## 2018-04-18 ENCOUNTER — Other Ambulatory Visit: Payer: Self-pay | Admitting: "Endocrinology

## 2018-04-18 DIAGNOSIS — E039 Hypothyroidism, unspecified: Secondary | ICD-10-CM

## 2018-04-18 DIAGNOSIS — E119 Type 2 diabetes mellitus without complications: Secondary | ICD-10-CM

## 2018-04-19 ENCOUNTER — Ambulatory Visit (INDEPENDENT_AMBULATORY_CARE_PROVIDER_SITE_OTHER): Payer: Medicare Other | Admitting: "Endocrinology

## 2018-04-19 ENCOUNTER — Other Ambulatory Visit: Payer: Self-pay

## 2018-04-19 ENCOUNTER — Ambulatory Visit: Payer: Medicare Other | Admitting: "Endocrinology

## 2018-04-19 ENCOUNTER — Encounter: Payer: Self-pay | Admitting: "Endocrinology

## 2018-04-19 DIAGNOSIS — E039 Hypothyroidism, unspecified: Secondary | ICD-10-CM | POA: Diagnosis not present

## 2018-04-19 DIAGNOSIS — E119 Type 2 diabetes mellitus without complications: Secondary | ICD-10-CM

## 2018-04-19 MED ORDER — LEVOTHYROXINE SODIUM 88 MCG PO CAPS: 1.0000 | ORAL_CAPSULE | Freq: Every day | ORAL | 6 refills | Status: DC

## 2018-04-19 NOTE — Progress Notes (Signed)
HPI  Erin Chen is a 68 y.o.-year-old female,here to follow up for hypothyroidism and type 2 DM. -Phone visit. She has hx of hypothyroidism, on Tirosint 75 mcg p.o. daily before breakfast.  She has history of intolerance to levothyroxine and Synthroid.    - he has no new complaints today.  She denies cold intolerance, constipation. -She denies palpitations, tremors, heat intolerance.     She denies family history of thyroid dysfunction. She denies personal history of goiter. She denies family history of thyroid cancer. She denies history of antithyroid therapy nor radiation to the neck.   Allergies as of 04/19/2018      Reactions   Levothyroxine    Synthroid [levothyroxine Sodium]       Medication List       Accurate as of April 19, 2018 11:50 AM. Always use your most recent med list.        Fish Oil 1000 MG Caps Take 2,000 mg by mouth daily.   Levothyroxine Sodium 88 MCG Caps Commonly known as:  Tirosint Take 1 capsule (88 mcg total) by mouth daily before breakfast.   simvastatin 20 MG tablet Commonly known as:  ZOCOR Take 20 mg by mouth daily.   Vitamin D3 50 MCG (2000 UT) Tabs Take by mouth daily.        PE: LMP 05/22/2017  Wt Readings from Last 3 Encounters:  04/13/17 149 lb (67.6 kg)  12/21/16 156 lb (70.8 kg)  06/16/16 153 lb (69.4 kg)     April 06, 2017 labs showed: Free T4 within normal limits at 1.4 2, TSH within normal limits 0.4 - RECENT LABS SHOWING A1C OF 7.1% INCREASING FROM 6.6%.   Recent Results (from the past 2160 hour(s))  Lipid panel     Status: Abnormal   Collection Time: 04/16/18 12:00 AM  Result Value Ref Range   Triglycerides 45 40 - 160   Cholesterol 182 0 - 200   HDL 91 (A) 35 - 70   LDL Cholesterol 78   Hemoglobin A1c     Status: None   Collection Time: 04/16/18 12:00 AM  Result Value Ref Range   Hemoglobin A1C 7.1   TSH     Status: None   Collection Time: 04/16/18 12:00 AM  Result Value Ref Range   TSH 3.25 0.41 - 5.90     Comment: ft4 1.0    ASSESSMENT: 1. 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 2. Type 2 diabetes with A1c 7.1%  PLAN:  -  Her thyroid function tests are such that she will benefit from slight increase in her Tirosint dose.  I discussed and increased her Tirosint to 88 mcg p.o. daily before breakfast.  - We discussed about the correct intake of her thyroid hormone, on empty stomach at fasting, with water, separated by at least 30 minutes from breakfast and other medications,  and separated by more than 4 hours from calcium, iron, multivitamins, acid reflux medications (PPIs). -Patient is made aware of the fact that thyroid hormone replacement is needed for life, dose to be adjusted by periodic monitoring of thyroid function tests.   Regarding her controlled type 2 DM:  -Hesitant on going on medications.  A1c 7.1% unchanged from her last visit.  She will be approved for metformin treatment if her next visit A1c is greater than 8%.    - Patient admits there is a room for improvement in her diet and drink choices. -  Suggestion is made for her to avoid simple carbohydrates  from her diet including Cakes, Sweet Desserts / Pastries, Ice Cream, Soda (diet and regular), Sweet Tea, Candies, Chips, Cookies, Store Bought Juices, Alcohol in Excess of  1-2 drinks a day, Artificial Sweeteners, and "Sugar-free" Products. This will help patient to have stable blood glucose profile and potentially avoid unintended weight gain.   Marquis LunchGebre Nadim Malia, MD [facility] [address] [place], [place] [address] Ph: [phone] F: [phone]  -  This note was partially dictated with voice recognition software. Similar sounding words can be transcribed inadequately or may not  be corrected upon review.  04/19/2018, 11:50 AM

## 2018-05-02 ENCOUNTER — Telehealth: Payer: Self-pay | Admitting: "Endocrinology

## 2018-05-02 DIAGNOSIS — R1310 Dysphagia, unspecified: Secondary | ICD-10-CM

## 2018-05-02 DIAGNOSIS — E039 Hypothyroidism, unspecified: Secondary | ICD-10-CM

## 2018-05-02 NOTE — Telephone Encounter (Signed)
Let us get thyroid/neck ultrasound as a baseline. You can order it for her to do in a nearby facility and let us know when she does it.

## 2018-05-02 NOTE — Telephone Encounter (Signed)
Pt called and states she was supposed to be referred to an ENT for difficulty swallowing?

## 2018-05-03 NOTE — Telephone Encounter (Signed)
Pt notified. U/S ordered and sent to central scheduling.Marland Kitchen

## 2018-05-16 ENCOUNTER — Other Ambulatory Visit: Payer: Self-pay

## 2018-05-16 ENCOUNTER — Ambulatory Visit (HOSPITAL_COMMUNITY)
Admission: RE | Admit: 2018-05-16 | Discharge: 2018-05-16 | Disposition: A | Discharge status: Home / Self Care | Payer: Medicare Other | Source: Ambulatory Visit | Attending: "Endocrinology | Admitting: "Endocrinology

## 2018-05-16 DIAGNOSIS — R1310 Dysphagia, unspecified: Secondary | ICD-10-CM | POA: Insufficient documentation

## 2018-05-16 DIAGNOSIS — E039 Hypothyroidism, unspecified: Secondary | ICD-10-CM | POA: Diagnosis present

## 2018-05-22 ENCOUNTER — Telehealth: Payer: Self-pay

## 2018-05-22 ENCOUNTER — Other Ambulatory Visit: Payer: Self-pay | Admitting: "Endocrinology

## 2018-05-22 MED ORDER — LEVOTHYROXINE SODIUM 88 MCG/ML PO SOLN: 88.0000 ug | Freq: Every day | ORAL | 3 refills | Status: DC

## 2018-05-22 NOTE — Telephone Encounter (Signed)
Spoke with patient about her results.

## 2018-05-22 NOTE — Telephone Encounter (Signed)
Erin Chen Is calling asking for Thyroid U/S results to be called to her she had this done 05/16/18 at [facility], Thanks

## 2018-06-28 ENCOUNTER — Other Ambulatory Visit (HOSPITAL_COMMUNITY): Payer: Self-pay | Admitting: Internal Medicine

## 2018-06-28 DIAGNOSIS — Z1231 Encounter for screening mammogram for malignant neoplasm of breast: Secondary | ICD-10-CM

## 2018-07-19 ENCOUNTER — Other Ambulatory Visit: Payer: Self-pay

## 2018-07-19 ENCOUNTER — Ambulatory Visit (HOSPITAL_COMMUNITY)
Admission: RE | Admit: 2018-07-19 | Discharge: 2018-07-19 | Disposition: A | Discharge status: Home / Self Care | Payer: Medicare Other | Source: Ambulatory Visit | Attending: Internal Medicine | Admitting: Internal Medicine

## 2018-07-19 DIAGNOSIS — Z1231 Encounter for screening mammogram for malignant neoplasm of breast: Secondary | ICD-10-CM

## 2018-08-03 ENCOUNTER — Other Ambulatory Visit: Payer: Self-pay | Admitting: "Endocrinology

## 2018-08-06 ENCOUNTER — Telehealth: Payer: Self-pay | Admitting: "Endocrinology

## 2018-08-06 ENCOUNTER — Telehealth: Payer: Self-pay

## 2018-08-06 DIAGNOSIS — E039 Hypothyroidism, unspecified: Secondary | ICD-10-CM

## 2018-08-06 MED ORDER — TIROSINT 75 MCG PO CAPS: ORAL_CAPSULE | ORAL | 0 refills | Status: DC

## 2018-08-06 NOTE — Telephone Encounter (Signed)
Tried to reach, left voicemail

## 2018-08-06 NOTE — Telephone Encounter (Signed)
Patient states that Levothyroxine Sodium (TIROSINT-SOL) 88 MCG/ML SOLN is causing her to itch and have diarrhea.

## 2018-08-06 NOTE — Telephone Encounter (Signed)
Erin Chen, CMA  

## 2018-08-06 NOTE — Telephone Encounter (Signed)
I want her to have her next set of labs soon as possible to make sure the dose is still good-TSH and free T4 . Tirosint is the best kind of thyroid hormone we have.  She may have more intolerance with the other choices.

## 2018-08-22 ENCOUNTER — Other Ambulatory Visit (HOSPITAL_COMMUNITY): Payer: Self-pay | Admitting: Internal Medicine

## 2018-08-22 DIAGNOSIS — K219 Gastro-esophageal reflux disease without esophagitis: Secondary | ICD-10-CM

## 2018-08-22 DIAGNOSIS — K449 Diaphragmatic hernia without obstruction or gangrene: Secondary | ICD-10-CM

## 2018-08-22 DIAGNOSIS — R1310 Dysphagia, unspecified: Secondary | ICD-10-CM

## 2018-08-27 ENCOUNTER — Ambulatory Visit (HOSPITAL_COMMUNITY): Payer: Medicare Other

## 2018-08-28 ENCOUNTER — Other Ambulatory Visit: Payer: Self-pay

## 2018-08-28 ENCOUNTER — Ambulatory Visit (HOSPITAL_COMMUNITY)
Admission: RE | Admit: 2018-08-28 | Discharge: 2018-08-28 | Disposition: A | Discharge status: Home / Self Care | Payer: Medicare Other | Source: Ambulatory Visit | Attending: Internal Medicine | Admitting: Internal Medicine

## 2018-08-28 DIAGNOSIS — K219 Gastro-esophageal reflux disease without esophagitis: Secondary | ICD-10-CM | POA: Insufficient documentation

## 2018-08-28 DIAGNOSIS — R1310 Dysphagia, unspecified: Secondary | ICD-10-CM | POA: Diagnosis present

## 2018-08-28 DIAGNOSIS — K449 Diaphragmatic hernia without obstruction or gangrene: Secondary | ICD-10-CM

## 2018-09-27 ENCOUNTER — Other Ambulatory Visit: Payer: Self-pay | Admitting: "Endocrinology

## 2018-09-27 DIAGNOSIS — E039 Hypothyroidism, unspecified: Secondary | ICD-10-CM

## 2018-09-27 NOTE — Telephone Encounter (Signed)
Should pt be taking 18mcg or the 50mcg sol

## 2018-10-22 ENCOUNTER — Ambulatory Visit: Payer: Medicare Other | Admitting: "Endocrinology

## 2018-10-25 ENCOUNTER — Ambulatory Visit: Payer: Medicare Other | Admitting: "Endocrinology

## 2018-12-05 ENCOUNTER — Other Ambulatory Visit: Payer: Self-pay | Admitting: "Endocrinology

## 2018-12-05 DIAGNOSIS — E039 Hypothyroidism, unspecified: Secondary | ICD-10-CM

## 2019-01-11 DIAGNOSIS — U071 COVID-19: Secondary | ICD-10-CM

## 2019-01-11 HISTORY — DX: COVID-19: U07.1

## 2019-02-27 LAB — BASIC METABOLIC PANEL
BUN: 15 (ref 4–21)
CO2: 30 — AB (ref 13–22)
Chloride: 105 (ref 99–108)
Creatinine: 0.8 (ref 0.5–1.1)
Glucose: 125
Potassium: 4.3 (ref 3.4–5.3)
Sodium: 140 (ref 137–147)

## 2019-02-27 LAB — CBC AND DIFFERENTIAL
HCT: 37 (ref 36–46)
Hemoglobin: 12.3 (ref 12.0–16.0)
Platelets: 143 — AB (ref 150–399)
WBC: 2.7

## 2019-02-27 LAB — HEMOGLOBIN A1C: Hemoglobin A1C: 7

## 2019-02-27 LAB — COMPREHENSIVE METABOLIC PANEL
Albumin: 4.2 (ref 3.5–5.0)
Calcium: 9.2 (ref 8.7–10.7)
Globulin: 2.7

## 2019-02-27 LAB — LIPID PANEL
Cholesterol: 154 (ref 0–200)
HDL: 75 — AB (ref 35–70)
LDL Cholesterol: 68
Triglycerides: 38 — AB (ref 40–160)

## 2019-02-27 LAB — HEPATIC FUNCTION PANEL
ALT: 32 (ref 7–35)
AST: 34 (ref 13–35)
Alkaline Phosphatase: 59 (ref 25–125)
Bilirubin, Total: 0.7

## 2019-02-27 LAB — TSH: TSH: 0.55 (ref 0.41–5.90)

## 2019-02-27 LAB — CBC: RBC: 3.97 (ref 3.87–5.11)

## 2019-03-13 ENCOUNTER — Other Ambulatory Visit (HOSPITAL_COMMUNITY): Payer: Self-pay | Admitting: Student

## 2019-03-13 ENCOUNTER — Other Ambulatory Visit: Payer: Self-pay | Admitting: Student

## 2019-03-13 DIAGNOSIS — R1310 Dysphagia, unspecified: Secondary | ICD-10-CM

## 2019-03-21 ENCOUNTER — Other Ambulatory Visit: Payer: Self-pay

## 2019-03-21 ENCOUNTER — Ambulatory Visit (HOSPITAL_COMMUNITY)
Admission: RE | Admit: 2019-03-21 | Discharge: 2019-03-21 | Disposition: A | Discharge status: Home / Self Care | Payer: Medicare Other | Source: Ambulatory Visit | Attending: Internal Medicine | Admitting: Internal Medicine

## 2019-03-21 DIAGNOSIS — R1310 Dysphagia, unspecified: Secondary | ICD-10-CM | POA: Diagnosis present

## 2019-04-04 ENCOUNTER — Other Ambulatory Visit: Payer: Self-pay | Admitting: "Endocrinology

## 2019-04-04 DIAGNOSIS — E039 Hypothyroidism, unspecified: Secondary | ICD-10-CM

## 2019-04-09 ENCOUNTER — Other Ambulatory Visit: Payer: Self-pay | Admitting: "Endocrinology

## 2019-04-09 ENCOUNTER — Telehealth: Payer: Self-pay | Admitting: "Endocrinology

## 2019-04-09 DIAGNOSIS — E039 Hypothyroidism, unspecified: Secondary | ICD-10-CM

## 2019-04-09 MED ORDER — LEVOTHYROXINE SODIUM 75 MCG PO CAPS: ORAL_CAPSULE | ORAL | 0 refills | Status: DC

## 2019-04-09 NOTE — Telephone Encounter (Signed)
Pt requesting refill Levothyroxine Sodium 75 MCG CAPS on [facility] [employer]. Last OV 04/2018,i have her labs and scheduled her for next week but she is out of medication

## 2019-04-09 NOTE — Telephone Encounter (Signed)
Will refill 30 days

## 2019-04-16 ENCOUNTER — Other Ambulatory Visit: Payer: Self-pay

## 2019-04-16 ENCOUNTER — Encounter: Payer: Self-pay | Admitting: "Endocrinology

## 2019-04-16 ENCOUNTER — Ambulatory Visit (INDEPENDENT_AMBULATORY_CARE_PROVIDER_SITE_OTHER): Payer: Medicare Other | Admitting: "Endocrinology

## 2019-04-16 VITALS — BP 126/73 | HR 72 | Ht 64.0 in | Wt 157.4 lb

## 2019-04-16 DIAGNOSIS — E039 Hypothyroidism, unspecified: Secondary | ICD-10-CM

## 2019-04-16 DIAGNOSIS — E119 Type 2 diabetes mellitus without complications: Secondary | ICD-10-CM | POA: Diagnosis not present

## 2019-04-16 MED ORDER — TIROSINT 88 MCG PO CAPS: 88.0000 ug | ORAL_CAPSULE | Freq: Every day | ORAL | 6 refills | Status: DC

## 2019-04-16 NOTE — Progress Notes (Signed)
04/16/2019  Endocrinology follow-up note  HPI   Erin Chen is a 69 y.o.-year-old female,here to follow up for hypothyroidism and type 2 DM.  She has hx of hypothyroidism, on Tirosint 75 mcg p.o. daily before breakfast.  She has history of intolerance to levothyroxine and Synthroid.    -  She returns with some weight gain.  Denies palpitations , tremors , or heat intolerance.     She denies family history of thyroid dysfunction. She denies personal history of goiter. She denies family history of thyroid cancer. She denies history of antithyroid therapy nor radiation to the neck.   Allergies as of 04/16/2019      Reactions   Levothyroxine    Synthroid [levothyroxine Sodium]       Medication List       Accurate as of April 16, 2019  4:41 PM. If you have any questions, ask your nurse or doctor.        STOP taking these medications   Levothyroxine Sodium 75 MCG Caps Commonly known as: Tirosint Replaced by: Tirosint 21 MCG Caps Stopped by: Glade Lloyd, MD     TAKE these medications   famotidine 40 MG/5ML suspension Commonly known as: PEPCID Take 8 mg by mouth 2 (two) times daily.   Fish Oil 1000 MG Caps Take 2,000 mg by mouth daily.   simvastatin 20 MG tablet Commonly known as: ZOCOR Take 20 mg by mouth daily.   Tirosint 88 MCG Caps Generic drug: Levothyroxine Sodium Take 1 capsule (88 mcg total) by mouth daily before breakfast. Replaces: Levothyroxine Sodium 75 MCG Caps Started by: Glade Lloyd, MD   Vitamin D3 50 MCG (2000 UT) Tabs Take by mouth daily.        PE: BP 126/73   Pulse 72   Ht 5\' 4"  (1.626 m)   Wt 157 lb 6.4 oz (71.4 kg)   LMP 05/22/2017   BMI 27.02 kg/m  Wt Readings from Last 3 Encounters:  04/16/19 157 lb 6.4 oz (71.4 kg)  04/13/17 149 lb (67.6 kg)  12/21/16 156 lb (70.8 kg)     April 06, 2017 labs showed: Free T4 within normal limits at 1.4 2, TSH within normal limits 0.4 - RECENT LABS SHOWING A1C OF 7.1% INCREASING FROM  6.6%.   Recent Results (from the past 2160 hour(s))  CBC and differential     Status: Abnormal   Collection Time: 02/27/19 12:00 AM  Result Value Ref Range   Hemoglobin 12.3 12.0 - 16.0   HCT 37 36 - 46   Platelets 143 (A) 150 - 399   WBC 2.7   CBC     Status: None   Collection Time: 02/27/19 12:00 AM  Result Value Ref Range   RBC 3.97 3.87 - 4.09  Basic metabolic panel     Status: Abnormal   Collection Time: 02/27/19 12:00 AM  Result Value Ref Range   Glucose 125    BUN 15 4 - 21   CO2 30 (A) 13 - 22   Creatinine 0.8 0.5 - 1.1   Potassium 4.3 3.4 - 5.3   Sodium 140 137 - 147   Chloride 105 99 - 108  Comprehensive metabolic panel     Status: None   Collection Time: 02/27/19 12:00 AM  Result Value Ref Range   Globulin 2.7    Calcium 9.2 8.7 - 10.7   Albumin 4.2 3.5 - 5.0  Lipid panel     Status: Abnormal   Collection  Time: 02/27/19 12:00 AM  Result Value Ref Range   Triglycerides 38 (A) 40 - 160   Cholesterol 154 0 - 200   HDL 75 (A) 35 - 70   LDL Cholesterol 68   Hepatic function panel     Status: None   Collection Time: 02/27/19 12:00 AM  Result Value Ref Range   Alkaline Phosphatase 59 25 - 125   ALT 32 7 - 35   AST 34 13 - 35   Bilirubin, Total 0.7   Hemoglobin A1c     Status: None   Collection Time: 02/27/19 12:00 AM  Result Value Ref Range   Hemoglobin A1C 7.0   TSH     Status: None   Collection Time: 02/27/19 12:00 AM  Result Value Ref Range   TSH 0.55 0.41 - 5.90    ASSESSMENT: 1. 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 2. Type 2 diabetes with A1c 7%  PLAN:  -  Her thyroid function tests are such that she will benefit from slight increase in her Tirosint dose.  I discussed and increased her  Tirosint to 88 mcg p.o. daily before breakfast.  - We discussed about the correct intake of her thyroid hormone, on empty stomach at fasting, with water, separated by at least 30 minutes from breakfast and other medications,  and separated by more than 4  hours from calcium, iron, multivitamins, acid reflux medications (PPIs). -Patient is made aware of the fact that thyroid hormone replacement is needed for life, dose to be adjusted by periodic monitoring of thyroid function tests.   Regarding her controlled type 2 DM:  -Hesitant on going on medications.  A1c 7% unchanged from her last visit.  She will be approached for Metformin treatment if her A1c increases to about 8%.  She is hesitant to go on Metformin at this time.    - she  admits there is a room for improvement in her diet and drink choices. -  Suggestion is made for her to avoid simple carbohydrates  from her diet including Cakes, Sweet Desserts / Pastries, Ice Cream, Soda (diet and regular), Sweet Tea, Candies, Chips, Cookies, Sweet Pastries,  Store Bought Juices, Alcohol in Excess of  1-2 drinks a day, Artificial Sweeteners, Coffee Creamer, and "Sugar-free" Products. This will help patient to have stable blood glucose profile and potentially avoid unintended weight gain.     - Time spent on this patient care encounter:  25 minutes of which 50% was spent in  counseling and the rest reviewing  her current and  previous labs / studies and medications  doses and developing a plan for long term care. [name]  participated in the discussions, expressed understanding, and voiced agreement with the above plans.  All questions were answered to her satisfaction. she is encouraged to contact clinic should she have any questions or concerns prior to her return visit.    [name], MD [facility] [address] [place], [place] [address] Ph: [phone] F: [phone]  -  This note was partially dictated with voice recognition software. Similar sounding words can be transcribed inadequately or may not  be corrected upon review.  04/16/2019, 4:41 PM

## 2019-06-12 ENCOUNTER — Other Ambulatory Visit (HOSPITAL_COMMUNITY): Payer: Self-pay | Admitting: Internal Medicine

## 2019-06-12 ENCOUNTER — Encounter: Payer: Self-pay | Admitting: Internal Medicine

## 2019-06-12 DIAGNOSIS — Z1231 Encounter for screening mammogram for malignant neoplasm of breast: Secondary | ICD-10-CM

## 2019-06-12 LAB — BASIC METABOLIC PANEL
BUN: 11 (ref 4–21)
CO2: 26 — AB (ref 13–22)
Chloride: 103 (ref 99–108)
Creatinine: 0.9 (ref 0.5–1.1)
Potassium: 4.2 (ref 3.4–5.3)
Sodium: 138 (ref 137–147)

## 2019-06-12 LAB — TSH
TSH: 0.04 — AB (ref 0.41–5.90)
TSH: 0.04 — AB (ref 0.41–5.90)

## 2019-06-12 LAB — LIPID PANEL
Cholesterol: 154 (ref 0–200)
HDL: 75 — AB (ref 35–70)
LDL Cholesterol: 68
Triglycerides: 38 — AB (ref 40–160)

## 2019-06-12 LAB — COMPREHENSIVE METABOLIC PANEL
Calcium: 9.2 (ref 8.7–10.7)
GFR calc Af Amer: 73
GFR calc non Af Amer: 63

## 2019-06-12 LAB — HEMOGLOBIN A1C: Hemoglobin A1C: 6.6

## 2019-06-17 ENCOUNTER — Other Ambulatory Visit: Payer: Self-pay | Admitting: "Endocrinology

## 2019-06-17 DIAGNOSIS — E039 Hypothyroidism, unspecified: Secondary | ICD-10-CM

## 2019-06-19 ENCOUNTER — Other Ambulatory Visit: Payer: Self-pay | Admitting: "Endocrinology

## 2019-06-19 ENCOUNTER — Telehealth: Payer: Self-pay | Admitting: "Endocrinology

## 2019-06-19 MED ORDER — TIROSINT 75 MCG PO CAPS: 75.0000 ug | ORAL_CAPSULE | Freq: Every day | ORAL | 3 refills | Status: DC

## 2019-06-19 NOTE — Telephone Encounter (Signed)
Pt made aware, understanding voiced. 

## 2019-06-19 NOTE — Telephone Encounter (Signed)
Pt had lab work on 06/12/19 which shows her TSH is 0.04 and her T4 free 1.7. Pt takes tirosent and asked if you recommend any changes.

## 2019-06-19 NOTE — Telephone Encounter (Signed)
Yes, she will need a lower dose of Tirosint @ 75 mcg. I will send it.

## 2019-06-19 NOTE — Telephone Encounter (Signed)
Patient is calling and would like to know if we received her lab work from Dr. Heron [facility]. Pt # [phone]

## 2019-07-22 ENCOUNTER — Ambulatory Visit (HOSPITAL_COMMUNITY)
Admission: RE | Admit: 2019-07-22 | Discharge: 2019-07-22 | Disposition: A | Discharge status: Home / Self Care | Payer: Medicare Other | Source: Ambulatory Visit | Attending: Internal Medicine | Admitting: Internal Medicine

## 2019-07-22 ENCOUNTER — Other Ambulatory Visit: Payer: Self-pay

## 2019-07-22 DIAGNOSIS — Z1231 Encounter for screening mammogram for malignant neoplasm of breast: Secondary | ICD-10-CM | POA: Diagnosis present

## 2019-09-25 LAB — HEMOGLOBIN A1C: Hemoglobin A1C: 7.5

## 2019-09-25 LAB — LIPID PANEL
Cholesterol: 156 (ref 0–200)
HDL: 77 — AB (ref 35–70)
LDL Cholesterol: 66
Triglycerides: 52 (ref 40–160)

## 2019-09-25 LAB — BASIC METABOLIC PANEL
BUN: 15 (ref 4–21)
Creatinine: 0.7 (ref 0.5–1.1)

## 2019-09-25 LAB — COMPREHENSIVE METABOLIC PANEL
Calcium: 9 (ref 8.7–10.7)
GFR calc Af Amer: 102
GFR calc non Af Amer: 88

## 2019-09-25 LAB — TSH: TSH: 0.97 (ref 0.41–5.90)

## 2019-10-17 ENCOUNTER — Ambulatory Visit: Payer: Medicare Other | Admitting: "Endocrinology

## 2019-10-23 ENCOUNTER — Encounter: Payer: Self-pay | Admitting: "Endocrinology

## 2019-10-23 ENCOUNTER — Other Ambulatory Visit: Payer: Self-pay

## 2019-10-23 ENCOUNTER — Ambulatory Visit (INDEPENDENT_AMBULATORY_CARE_PROVIDER_SITE_OTHER): Payer: Medicare Other | Admitting: "Endocrinology

## 2019-10-23 VITALS — BP 96/64 | HR 76 | Ht 64.0 in | Wt 161.2 lb

## 2019-10-23 DIAGNOSIS — E119 Type 2 diabetes mellitus without complications: Secondary | ICD-10-CM | POA: Diagnosis not present

## 2019-10-23 DIAGNOSIS — E039 Hypothyroidism, unspecified: Secondary | ICD-10-CM | POA: Diagnosis not present

## 2019-10-23 MED ORDER — GLIPIZIDE ER 2.5 MG PO TB24: 2.5000 mg | ORAL_TABLET | Freq: Every day | ORAL | 1 refills | Status: DC

## 2019-10-23 NOTE — Patient Instructions (Signed)

## 2019-10-23 NOTE — Progress Notes (Signed)
10/23/2019  Endocrinology follow-up note  HPI   Erin Chen is a 69 y.o.-year-old female,here to follow up for hypothyroidism and type 2 DM.  She has hx of hypothyroidism, on Tirosint 75 mcg p.o. daily before breakfast.  She reports better compliance with her medications this time.  Reporting steady weight, denies palpitations, tremors, heat/cold intolerance.      She denies family history of thyroid dysfunction. She denies personal history of goiter. She denies family history of thyroid cancer. She denies history of antithyroid therapy nor radiation to the neck.   Her previsit labs show A1c of 7.5%, increasing from 6.6%.  She is not on any medications for diabetes at this time she reports adverse effects from Metformin from prior attempt.  Also, she has difficulty swallowing larger pills.  Allergies as of 10/23/2019      Reactions   Levothyroxine    Synthroid [levothyroxine Sodium]       Medication List       Accurate as of October 23, 2019  1:08 PM. If you have any questions, ask your nurse or doctor.        STOP taking these medications   simvastatin 20 MG tablet Commonly known as: ZOCOR Stopped by: [name], MD     TAKE these medications   amLODipine 5 MG tablet Commonly known as: NORVASC Take 5 mg by mouth 2 (two) times daily.   atorvastatin 20 MG tablet Commonly known as: LIPITOR Take 1 tablet by mouth daily.   famotidine 20 MG tablet Commonly known as: PEPCID Take 20 mg by mouth 2 (two) times daily. What changed: Another medication with the same name was removed. Continue taking this medication, and follow the directions you see here. Changed by: [name], MD   Fish Oil 1000 MG Caps Take 2,000 mg by mouth daily.   glipiZIDE 2.5 MG 24 hr tablet Commonly known as: GLUCOTROL XL Take 1 tablet (2.5 mg total) by mouth daily with breakfast. Started by: [name], MD   Tirosint 75 MCG Caps Generic drug: Levothyroxine Sodium Take 1 capsule (75  mcg total) by mouth daily before breakfast.   Vitamin D3 50 MCG (2000 UT) Tabs Take by mouth daily.        PE: BP 96/64   Pulse 76   Ht 5\' 4"  (1.626 m)   Wt 161 lb 3.2 oz (73.1 kg)   LMP 05/22/2017   BMI 27.67 kg/m  Wt Readings from Last 3 Encounters:  10/23/19 161 lb 3.2 oz (73.1 kg)  04/16/19 157 lb 6.4 oz (71.4 kg)  04/13/17 149 lb (67.6 kg)     April 06, 2017 labs showed: Free T4 within normal limits at 1.4 2, TSH within normal limits 0.4 - RECENT LABS SHOWING A1C OF 7.1% INCREASING FROM 6.6%.   Recent Results (from the past 2160 hour(s))  Basic metabolic panel     Status: None   Collection Time: 09/25/19 12:00 AM  Result Value Ref Range   BUN 15 4 - 21   Creatinine 0.7 0.5 - 1.1  Comprehensive metabolic panel     Status: None   Collection Time: 09/25/19 12:00 AM  Result Value Ref Range   GFR calc Af Amer 102    GFR calc non Af Amer 88    Calcium 9.0 8.7 - 10.7  Lipid panel     Status: Abnormal   Collection Time: 09/25/19 12:00 AM  Result Value Ref Range   Triglycerides 52 40 - 160  Cholesterol 156 0 - 200   HDL 77 (A) 35 - 70   LDL Cholesterol 66   Hemoglobin A1c     Status: None   Collection Time: 09/25/19 12:00 AM  Result Value Ref Range   Hemoglobin A1C 7.5   TSH     Status: None   Collection Time: 09/25/19 12:00 AM  Result Value Ref Range   TSH 0.97 0.41 - 5.90    Comment: free t4- 1.7    ASSESSMENT: 1. 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 2. Type 2 diabetes with A1c 7%  PLAN:  -  Her thyroid function tests are consistent with appropriate replacement.  She is advised to continue Tirosint 75 mcg p.o. daily before breakfast.     - We discussed about the correct intake of her thyroid hormone, on empty stomach at fasting, with water, separated by at least 30 minutes from breakfast and other medications,  and separated by more than 4 hours from calcium, iron, multivitamins, acid reflux medications (PPIs). -Patient is made aware of the fact  that thyroid hormone replacement is needed for life, dose to be adjusted by periodic monitoring of thyroid function tests.    Regarding her controlled type 2 DM:  -Hesitant on going on medications.  Her A1c is increasing to 7.5%.  Due to her concern about difficulty swallowing large pills, she is offered treatment with glipizide low-dose.  I discussed and initiated glipizide 2.5 mg XL p.o. daily at breakfast.    - she  admits there is a room for improvement in her diet and drink choices. -  Suggestion is made for her to avoid simple carbohydrates  from her diet including Cakes, Sweet Desserts / Pastries, Ice Cream, Soda (diet and regular), Sweet Tea, Candies, Chips, Cookies, Sweet Pastries,  Store Bought Juices, Alcohol in Excess of  1-2 drinks a day, Artificial Sweeteners, Coffee Creamer, and "Sugar-free" Products. This will help patient to have stable blood glucose profile and potentially avoid unintended weight gain.  She is advised to maintain close follow-up with her PMD.     - Time spent on this patient care encounter:  20 minutes of which 50% was spent in  counseling and the rest reviewing  her current and  previous labs / studies and medications  doses and developing a plan for long term care. [name]  participated in the discussions, expressed understanding, and voiced agreement with the above plans.  All questions were answered to her satisfaction. she is encouraged to contact clinic should she have any questions or concerns prior to her return visit.     [name], MD [facility] [address] [place], [place] [address] Ph: [phone] F: [phone]  -  This note was partially dictated with voice recognition software. Similar sounding words can be transcribed inadequately or may not  be corrected upon review.  10/23/2019, 1:08 PM

## 2019-10-24 ENCOUNTER — Telehealth: Payer: Self-pay

## 2019-10-24 DIAGNOSIS — E119 Type 2 diabetes mellitus without complications: Secondary | ICD-10-CM

## 2019-10-24 MED ORDER — GLIPIZIDE ER 2.5 MG PO TB24: 2.5000 mg | ORAL_TABLET | Freq: Every day | ORAL | 1 refills | Status: DC

## 2019-10-24 NOTE — Telephone Encounter (Signed)
Can you resend the glipizide to [facility]

## 2019-10-24 NOTE — Telephone Encounter (Signed)
Resent Rx to [facility] in [place]

## 2019-11-13 ENCOUNTER — Other Ambulatory Visit: Payer: Self-pay | Admitting: "Endocrinology

## 2020-01-24 ENCOUNTER — Other Ambulatory Visit: Payer: Self-pay

## 2020-01-24 ENCOUNTER — Telehealth: Payer: Self-pay

## 2020-01-24 DIAGNOSIS — E119 Type 2 diabetes mellitus without complications: Secondary | ICD-10-CM

## 2020-01-24 MED ORDER — TIROSINT 75 MCG PO CAPS: ORAL_CAPSULE | ORAL | 1 refills | Status: DC

## 2020-01-24 MED ORDER — GLIPIZIDE ER 2.5 MG PO TB24: 2.5000 mg | ORAL_TABLET | Freq: Every day | ORAL | 1 refills | Status: DC

## 2020-01-24 NOTE — Telephone Encounter (Signed)
Sent in

## 2020-01-24 NOTE — Telephone Encounter (Signed)
Pt requesting refill on her glipizide and her Tirosint. [facility]

## 2020-02-03 ENCOUNTER — Telehealth: Payer: Self-pay | Admitting: "Endocrinology

## 2020-02-03 NOTE — Telephone Encounter (Signed)
Pt is calling and states that TIROSINT 75 MCG CAPS  Was called in. She states she has to pay for the caps but if you could call in the tablets for her it is free. Please advise  [facility] [place], [place] - [address] [place], [address] Phone:  [phone]  Fax:  [phone]

## 2020-02-04 NOTE — Telephone Encounter (Signed)
Joy, This patient also needs her Tirosint, but we know it does not come in tablets. Pharmacies advising patients to switch. May need PA.

## 2020-02-04 NOTE — Telephone Encounter (Signed)
Left a message requesting a return call to the office. 

## 2020-02-05 NOTE — Telephone Encounter (Signed)
Discussed with pt, understanding voiced. Prior authorization request sent through Covermymeds to [facility].

## 2020-02-10 NOTE — Telephone Encounter (Signed)
PA was approved but Tirosent is on a higher insurance tier.

## 2020-02-10 NOTE — Telephone Encounter (Signed)
This copay obviously is too much. She can retry synthroid if PA is not going thru.

## 2020-02-10 NOTE — Telephone Encounter (Signed)
Pt left a VM and from what I could hear of the voicemail she is going to have a copay of 385.00 for this RX. Please advise

## 2020-02-19 ENCOUNTER — Other Ambulatory Visit: Payer: Self-pay

## 2020-02-19 DIAGNOSIS — E119 Type 2 diabetes mellitus without complications: Secondary | ICD-10-CM

## 2020-02-19 MED ORDER — TIROSINT 75 MCG PO CAPS: ORAL_CAPSULE | ORAL | 1 refills | Status: DC

## 2020-04-08 ENCOUNTER — Other Ambulatory Visit: Payer: Self-pay

## 2020-04-08 ENCOUNTER — Ambulatory Visit (INDEPENDENT_AMBULATORY_CARE_PROVIDER_SITE_OTHER): Payer: Medicare Other | Admitting: Pulmonary Disease

## 2020-04-08 ENCOUNTER — Encounter: Payer: Self-pay | Admitting: Pulmonary Disease

## 2020-04-08 VITALS — BP 140/72 | HR 80 | Temp 98.2°F | Ht 65.0 in | Wt 167.8 lb

## 2020-04-08 DIAGNOSIS — G4733 Obstructive sleep apnea (adult) (pediatric): Secondary | ICD-10-CM | POA: Diagnosis not present

## 2020-04-08 NOTE — Patient Instructions (Signed)
Will get copy of your medical records from doctors in [place] and from [place] [facility] [place]  Will send order to arrange for new CPAP machine  Follow up in 5 to 6 months

## 2020-04-08 NOTE — Progress Notes (Signed)
[facility]  Chief Complaint  Patient presents with  . Consult    Sleep consult-currently on cpap     Constitutional:  BP 140/72 (BP Location: Left Arm, Cuff Size: Normal)   Pulse 80   Temp 98.2 F (36.8 C) (Other (Comment)) Comment (Src): wrist  Ht 5\' 5"  (1.651 m)   Wt 167 lb 12.8 oz (76.1 kg)   LMP 05/22/2017   SpO2 100% Comment: Room air  BMI 27.92 kg/m   Past Medical History:  Anemia, GERD, Glaucoma, HLD, COVID 29 January 2019, Hypothyroidism, DM type 2  Past Surgical History:  She  has a past surgical history that includes Tubal ligation and Vaginal hysterectomy.  Brief Summary:  Erin Chen is a 70 y.o. female with obstructive sleep apnea.      Subjective:   She had a sleep study several years ago through [facility] in [place].  She was set up with CPAP.  She has been using [place] [facility] [place] for her DME.  She thinks her machine is more than 72 yrs old.  She uses on regular basis, but feels the machine isn't working as well as before.  She has a full face mask, and no issue with mask fit.  She has noticed feeling more tired during the day.  She goes to sleep at 11 pm.  She falls asleep in few minutes.  She wakes up 1 or 2 times to use the bathroom, but then can have trouble falling back to sleep.  She gets out of bed at 530 am.  She feels tired in the morning.  She denies morning headache.  She does not use anything to help her fall sleep or stay awake.  She denies sleep walking, sleep talking, bruxism, or nightmares.  There is no history of restless legs.  She denies sleep hallucinations, sleep paralysis, or cataplexy.  The Epworth score is 2 out of 24.    Physical Exam:   Appearance - well kempt   ENMT - no sinus tenderness, no oral exudate, no LAN, Mallampati 3 airway, no stridor  Respiratory - equal breath sounds bilaterally, no wheezing or rales  CV - s1s2 regular rate and rhythm, no  murmurs  Ext - no clubbing, no edema  Skin - no rashes  Psych - normal mood and affect   Sleep Tests:  CPAP 03/09/20 to 04/07/20 >> used on 19 of 30 nights with average 4 hrs 23 min.  Average AHI 4.3 with CPAP 5 cm H2O.  Social History:  She  reports that she has never smoked. She has never used smokeless tobacco. She reports that she does not drink alcohol and does not use drugs.  Family History:  Her family history includes Diabetes in her mother; Heart attack in her father.     Assessment/Plan:   Obstructive sleep apnea. - she reports compliance with CPAP and benefit from therapy - she uses [facility] for her DME - she believes her current device is more than 72 yrs old, not functioning properly and not amenable to repair - will arrange for new auto CPAP 5 to 15 cm H2O  Hypothyroidism, Diabetes mellitus type 2. - followed by Dr. [place] with [facility]  Obesity. - discussed how weight can impact sleep and risk for sleep disordered breathing - discussed options to assist with weight loss: combination of diet modification, cardiovascular and strength training exercises  Cardiovascular risk. - had an extensive  discussion regarding the adverse health consequences related to untreated sleep disordered breathing - specifically discussed the risks for hypertension, coronary artery disease, cardiac dysrhythmias, cerebrovascular disease, and diabetes - lifestyle modification discussed  Safe driving practices. - discussed how sleep disruption can increase risk of accidents, particularly when driving - safe driving practices were discussed  Therapies for obstructive sleep apnea. - if the sleep study shows significant sleep apnea, then various therapies for treatment were reviewed: CPAP, oral appliance, and surgical interventions  Time Spent Involved in Patient Care on Day of Examination:  31 minutes  Follow up:  Patient Instructions   Will get copy of your medical records from doctors in [place] and from [place] [facility] [place]  Will send order to arrange for new CPAP machine  Follow up in 5 to 6 months   Medication List:   Allergies as of 04/08/2020      Reactions   Levothyroxine    Synthroid [levothyroxine Sodium]       Medication List       Accurate as of April 08, 2020  9:18 AM. If you have any questions, ask your nurse or doctor.        amLODipine 5 MG tablet Commonly known as: NORVASC Take 5 mg by mouth 2 (two) times daily.   atorvastatin 20 MG tablet Commonly known as: LIPITOR Take 1 tablet by mouth daily.   famotidine 20 MG tablet Commonly known as: PEPCID Take 20 mg by mouth 2 (two) times daily.   Fish Oil 1000 MG Caps Take 2,000 mg by mouth daily.   glipiZIDE 2.5 MG 24 hr tablet Commonly known as: GLUCOTROL XL Take 1 tablet (2.5 mg total) by mouth daily with breakfast.   Tirosint 75 MCG Caps Generic drug: Levothyroxine Sodium TAKE 1 CAPSULE BY MOUTH ONCE DAILY BEFORE BREAKFAST   Vitamin D3 50 MCG (2000 UT) Tabs Take by mouth daily.       Signature:  Coralyn Helling, MD [facility] Pager - [phone] 04/08/2020, 9:18 AM

## 2020-04-15 LAB — TSH: TSH: 0.94 (ref 0.41–5.90)

## 2020-04-21 ENCOUNTER — Other Ambulatory Visit: Payer: Self-pay | Admitting: "Endocrinology

## 2020-04-21 DIAGNOSIS — E119 Type 2 diabetes mellitus without complications: Secondary | ICD-10-CM

## 2020-04-21 DIAGNOSIS — E039 Hypothyroidism, unspecified: Secondary | ICD-10-CM

## 2020-04-21 MED ORDER — TIROSINT 75 MCG PO CAPS: ORAL_CAPSULE | ORAL | 1 refills | Status: DC

## 2020-04-22 ENCOUNTER — Ambulatory Visit (INDEPENDENT_AMBULATORY_CARE_PROVIDER_SITE_OTHER): Payer: Medicare Other | Admitting: "Endocrinology

## 2020-04-22 ENCOUNTER — Other Ambulatory Visit: Payer: Self-pay

## 2020-04-22 ENCOUNTER — Encounter: Payer: Self-pay | Admitting: "Endocrinology

## 2020-04-22 VITALS — BP 126/71 | HR 72 | Ht 65.0 in | Wt 167.4 lb

## 2020-04-22 DIAGNOSIS — E039 Hypothyroidism, unspecified: Secondary | ICD-10-CM

## 2020-04-22 DIAGNOSIS — E119 Type 2 diabetes mellitus without complications: Secondary | ICD-10-CM | POA: Diagnosis not present

## 2020-04-22 DIAGNOSIS — E782 Mixed hyperlipidemia: Secondary | ICD-10-CM | POA: Diagnosis not present

## 2020-04-22 LAB — POCT GLYCOSYLATED HEMOGLOBIN (HGB A1C): HbA1c, POC (controlled diabetic range): 6.4 % (ref 0.0–7.0)

## 2020-04-22 MED ORDER — ACCU-CHEK GUIDE ME W/DEVICE KIT: 1.0000 | PACK | 0 refills | Status: AC

## 2020-04-22 MED ORDER — ACCU-CHEK GUIDE VI STRP: ORAL_STRIP | 2 refills | Status: DC

## 2020-04-22 NOTE — Progress Notes (Signed)
04/22/2020  Endocrinology follow-up note  HPI   Erin Chen is a 70 y.o.-year-old female,here to follow up for hypothyroidism, type 2 diabetes.   She has hx of hypothyroidism with intolerance to levothyroxine, Synthroid.  She is currently being treated with Tirosint 75 mcg p.o. daily before breakfast.  - She reports better compliance with her medications this time.  Reporting gain of 6 pounds since October.  She denies palpitations, tremors, heat/cold intolerance.     She denies family history of thyroid dysfunction. She denies personal history of goiter. She denies family history of thyroid cancer. She denies history of antithyroid therapy nor radiation to the neck.   She had problems swallowing Metformin, was initiated on low-dose glipizide during her last visit.  Her point-of-care A1c is 6.4% improving from 7.5%.  She is on glipizide 2.5 mg XL p.o. daily at breakfast.     Allergies as of 04/22/2020      Reactions   Levothyroxine    Synthroid [levothyroxine Sodium]       Medication List       Accurate as of April 22, 2020  9:48 AM. If you have any questions, ask your nurse or doctor.        Accu-Chek Guide Me w/Device Kit 1 Piece by Does not apply route as directed. Started by: Glade Lloyd, MD   Accu-Chek Guide test strip Generic drug: glucose blood Check glucose once a day Started by: Glade Lloyd, MD   amLODipine 5 MG tablet Commonly known as: NORVASC Take 5 mg by mouth 2 (two) times daily.   atorvastatin 20 MG tablet Commonly known as: LIPITOR Take 1 tablet by mouth daily.   famotidine 20 MG tablet Commonly known as: PEPCID Take 20 mg by mouth 2 (two) times daily.   Fish Oil 1000 MG Caps Take 2,000 mg by mouth daily.   glipiZIDE 2.5 MG 24 hr tablet Commonly known as: GLUCOTROL XL Take 1 tablet (2.5 mg total) by mouth daily with breakfast.   Tirosint 75 MCG Caps Generic drug: Levothyroxine Sodium TAKE 1 CAPSULE BY MOUTH ONCE DAILY BEFORE BREAKFAST    Vitamin D3 50 MCG (2000 UT) Tabs Take by mouth daily.        PE: BP 126/71   Pulse 72   Ht '5\' 5"'  (1.651 m)   Wt 167 lb 6.4 oz (75.9 kg)   LMP 05/22/2017   BMI 27.86 kg/m  Wt Readings from Last 3 Encounters:  04/22/20 167 lb 6.4 oz (75.9 kg)  04/08/20 167 lb 12.8 oz (76.1 kg)  10/23/19 161 lb 3.2 oz (73.1 kg)     April 06, 2017 labs showed: Free T4 within normal limits at 1.4 2, TSH within normal limits 0.4 - RECENT LABS SHOWING A1C OF 7.1% INCREASING FROM 6.6%.   Recent Results (from the past 2160 hour(s))  TSH     Status: None   Collection Time: 04/15/20 12:00 AM  Result Value Ref Range   TSH 0.94 0.41 - 5.90    Comment: FREE T4- 1.23  HgB A1c     Status: None   Collection Time: 04/22/20  8:35 AM  Result Value Ref Range   Hemoglobin A1C     HbA1c POC (<> result, manual entry)     HbA1c, POC (prediabetic range)     HbA1c, POC (controlled diabetic range) 6.4 0.0 - 7.0 %    ASSESSMENT: 1. 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 2. Type 2 diabetes with A1c 7%  PLAN:  -  Her thyroid function tests are consistent with appropriate replacement.  She is advised to continue  Tirosint 75 mcg p.o. daily before breakfast.    - We discussed about the correct intake of her thyroid hormone, on empty stomach at fasting, with water, separated by at least 30 minutes from breakfast and other medications,  and separated by more than 4 hours from calcium, iron, multivitamins, acid reflux medications (PPIs). -Patient is made aware of the fact that thyroid hormone replacement is needed for life, dose to be adjusted by periodic monitoring of thyroid function tests.    Regarding her controlled type 2 DM:  --She has responded to low-dose glipizide.  She did have problem swallowing Metformin.  She is advised to continue glipizide 2.5 mg p.o. daily at breakfast.  Her point-of-care A1c 6.4% improving from 7.5%.  Prescription for a meter and testing supplies were sent to the pharmacy  for her.  She is advised to monitor blood glucose when she gets symptoms of hypoglycemia.  Symptoms of hypoglycemia were discussed with her in the clinic. - she acknowledges that there is a room for improvement in her food and drink choices. - Suggestion is made for her to avoid simple carbohydrates  from her diet including Cakes, Sweet Desserts, Ice Cream, Soda (diet and regular), Sweet Tea, Candies, Chips, Cookies, Store Bought Juices, Alcohol in Excess of  1-2 drinks a day, Artificial Sweeteners,  Coffee Creamer, and "Sugar-free" Products, Lemonade. This will help patient to have more stable blood glucose profile and potentially avoid unintended weight gain.  Her blood pressure is controlled at 126/71 currently on amlodipine 5 mg p.o. daily.  She has controlled lipid panel with LDL of 66.  She is advised to continue atorvastatin 20 mg p.o. nightly.  POCT ABI Results 04/22/20  Her screening ABI was negative for PAD today April 22, 2020.  Her neck study is due in April 2027, or sooner if needed. Right ABI: 1.2       left ABI: 1.18  Right leg systolic / diastolic: 670/14 mmHg Left leg systolic / diastolic: 103/01 mmHg  Arm systolic / diastolic: 314/38 mmHG  She is advised to maintain close follow-up with her PMD.    I spent 35 minutes in the care of the patient today including review of labs from Thyroid Function, CMP, and other relevant labs ; imaging/biopsy records (current and previous including abstractions from other facilities); face-to-face time discussing  her lab results and symptoms, medications doses, her options of short and long term treatment based on the latest standards of care / guidelines;   and documenting the encounter. We discussed in detail about diabetes, hypothyroidism, hypertension, hyperlipidemia Adryan H Krapf  participated in the discussions, expressed understanding, and voiced agreement with the above plans.  All questions were answered to her satisfaction. she is  encouraged to contact clinic should she have any questions or concerns prior to her return visit.     Glade Lloyd, MD [facility] [address] [place],  [address] Ph: [phone] F: [phone]  -  This note was partially dictated with voice recognition software. Similar sounding words can be transcribed inadequately or may not  be corrected upon review.  04/22/2020, 9:48 AM

## 2020-04-22 NOTE — Patient Instructions (Signed)

## 2020-05-22 ENCOUNTER — Other Ambulatory Visit: Payer: Self-pay | Admitting: "Endocrinology

## 2020-05-22 DIAGNOSIS — E119 Type 2 diabetes mellitus without complications: Secondary | ICD-10-CM

## 2020-06-17 ENCOUNTER — Telehealth: Payer: Self-pay

## 2020-06-17 DIAGNOSIS — E119 Type 2 diabetes mellitus without complications: Secondary | ICD-10-CM

## 2020-06-17 MED ORDER — TIROSINT 75 MCG PO CAPS: ORAL_CAPSULE | ORAL | 1 refills | Status: DC

## 2020-06-17 NOTE — Telephone Encounter (Signed)
Pt requesting refill on   TIROSINT 75 MCG CAPS to be sent to [facility]

## 2020-06-17 NOTE — Telephone Encounter (Signed)
Rx sent 

## 2020-06-19 ENCOUNTER — Other Ambulatory Visit: Payer: Self-pay | Admitting: "Endocrinology

## 2020-06-19 DIAGNOSIS — E119 Type 2 diabetes mellitus without complications: Secondary | ICD-10-CM

## 2020-06-24 ENCOUNTER — Other Ambulatory Visit (HOSPITAL_COMMUNITY): Payer: Self-pay | Admitting: Internal Medicine

## 2020-06-24 ENCOUNTER — Other Ambulatory Visit: Payer: Self-pay | Admitting: "Endocrinology

## 2020-06-24 ENCOUNTER — Telehealth: Payer: Self-pay

## 2020-06-24 DIAGNOSIS — Z1231 Encounter for screening mammogram for malignant neoplasm of breast: Secondary | ICD-10-CM

## 2020-06-24 MED ORDER — SYNTHROID 75 MCG PO TABS: 75.0000 ug | ORAL_TABLET | Freq: Every day | ORAL | 1 refills | Status: DC

## 2020-06-24 NOTE — Telephone Encounter (Signed)
Discussed with pt she has listed levothyroxine and synthroid as an allergy, she stated she could not remember what type of reaction she had but would like to try the levothyroxine tablets again.

## 2020-06-24 NOTE — Telephone Encounter (Signed)
Pt is asking for tablets instead of capsules of thyroid medication. [employer]

## 2020-06-25 ENCOUNTER — Other Ambulatory Visit: Payer: Self-pay | Admitting: "Endocrinology

## 2020-06-29 ENCOUNTER — Other Ambulatory Visit: Payer: Self-pay

## 2020-06-29 MED ORDER — ACCU-CHEK GUIDE VI STRP: ORAL_STRIP | 3 refills | Status: DC

## 2020-07-27 ENCOUNTER — Other Ambulatory Visit: Payer: Self-pay

## 2020-07-27 ENCOUNTER — Ambulatory Visit (HOSPITAL_COMMUNITY)
Admission: RE | Admit: 2020-07-27 | Discharge: 2020-07-27 | Disposition: A | Discharge status: Home / Self Care | Payer: Medicare Other | Source: Ambulatory Visit | Attending: Internal Medicine | Admitting: Internal Medicine

## 2020-07-27 DIAGNOSIS — Z1231 Encounter for screening mammogram for malignant neoplasm of breast: Secondary | ICD-10-CM

## 2020-08-21 ENCOUNTER — Other Ambulatory Visit: Payer: Self-pay | Admitting: "Endocrinology

## 2020-08-21 DIAGNOSIS — E119 Type 2 diabetes mellitus without complications: Secondary | ICD-10-CM

## 2020-10-05 LAB — HEMOGLOBIN A1C: Hemoglobin A1C: 7

## 2020-10-05 LAB — TSH: TSH: 0.91 (ref 0.41–5.90)

## 2020-10-05 LAB — BASIC METABOLIC PANEL
BUN: 12 (ref 4–21)
Creatinine: 0.8 (ref 0.5–1.1)

## 2020-10-21 ENCOUNTER — Ambulatory Visit (INDEPENDENT_AMBULATORY_CARE_PROVIDER_SITE_OTHER): Payer: Medicare Other | Admitting: "Endocrinology

## 2020-10-21 ENCOUNTER — Encounter: Payer: Self-pay | Admitting: "Endocrinology

## 2020-10-21 ENCOUNTER — Other Ambulatory Visit: Payer: Self-pay

## 2020-10-21 VITALS — BP 142/82 | HR 76 | Ht 65.0 in | Wt 168.2 lb

## 2020-10-21 DIAGNOSIS — E782 Mixed hyperlipidemia: Secondary | ICD-10-CM | POA: Diagnosis not present

## 2020-10-21 DIAGNOSIS — E119 Type 2 diabetes mellitus without complications: Secondary | ICD-10-CM

## 2020-10-21 DIAGNOSIS — E039 Hypothyroidism, unspecified: Secondary | ICD-10-CM | POA: Diagnosis not present

## 2020-10-21 MED ORDER — GLIPIZIDE ER 5 MG PO TB24: 5.0000 mg | ORAL_TABLET | Freq: Every day | ORAL | 1 refills | Status: DC

## 2020-10-21 NOTE — Progress Notes (Signed)
10/21/2020  Endocrinology follow-up note  HPI   Erin Chen is a 70 y.o.-year-old female, here to follow up for hypothyroidism, type 2 diabetes.   She has hx of hypothyroidism with intolerance to levothyroxine.  As of June 2022, she is taking Synthroid 75 mcg p.o. daily which she tolerates reasonably well.  - She reports better compliance with her medications this time.  She has steady weight, denies palpitations, tremors, nor heat intolerance.      She denies family history of thyroid dysfunction. She denies personal history of goiter. She denies family history of thyroid cancer. She denies history of antithyroid therapy nor radiation to the neck.   She had problems swallowing Metformin, was initiated on low-dose glipizide during her last visit.  Her previsit labs show A1c was 7%, increasing from 6.4%.  She is on glipizide 2.5 mg XL p.o. daily at breakfast.     Allergies as of 10/21/2020       Reactions   Levothyroxine    Synthroid [levothyroxine Sodium]         Medication List        Accurate as of October 21, 2020  8:50 AM. If you have any questions, ask your nurse or doctor.          STOP taking these medications    amLODipine 5 MG tablet Commonly known as: NORVASC Stopped by: Erin Lloyd, MD   Fish Oil 1000 MG Caps Stopped by: Erin Lloyd, MD       TAKE these medications    Accu-Chek Guide Me w/Device Kit 1 Piece by Does not apply route as directed.   Accu-Chek Guide test strip Generic drug: glucose blood CHECK GLUCOSE ONCE DAILY   atorvastatin 20 MG tablet Commonly known as: LIPITOR Take 1 tablet by mouth daily.   CALCIUM 500 PO Take 1 tablet by mouth daily in the afternoon.   famotidine 20 MG tablet Commonly known as: PEPCID Take 20 mg by mouth 2 (two) times daily.   glipiZIDE 5 MG 24 hr tablet Commonly known as: GLUCOTROL XL Take 1 tablet (5 mg total) by mouth daily with breakfast. What changed:  medication strength how much to  take Changed by: Erin Lloyd, MD   lansoprazole 30 MG capsule Commonly known as: PREVACID Take 30 mg by mouth every morning.   Synthroid 75 MCG tablet Generic drug: levothyroxine Take 1 tablet (75 mcg total) by mouth daily before breakfast.   vitamin B-12 1000 MCG tablet Commonly known as: CYANOCOBALAMIN Take 1,000 mcg by mouth daily.   Vitamin D3 50 MCG (2000 UT) Tabs Take by mouth daily.         PE: BP (!) 142/82   Pulse 76   Ht '5\' 5"'  (1.651 m)   Wt 168 lb 3.2 oz (76.3 kg)   LMP 05/22/2017   BMI 27.99 kg/m  Wt Readings from Last 3 Encounters:  10/21/20 168 lb 3.2 oz (76.3 kg)  04/22/20 167 lb 6.4 oz (75.9 kg)  04/08/20 167 lb 12.8 oz (76.1 kg)     April 06, 2017 labs showed: Free T4 within normal limits at 1.4 2, TSH within normal limits 0.4 - RECENT LABS SHOWING A1C OF 7.1% INCREASING FROM 6.6%.   Recent Results (from the past 2160 hour(s))  TSH     Status: None   Collection Time: 10/05/20 12:00 AM  Result Value Ref Range   TSH 0.91 0.41 - 0.17  Basic metabolic panel     Status: None  Collection Time: 10/05/20 12:00 AM  Result Value Ref Range   BUN 12 4 - 21   Creatinine 0.8 0.5 - 1.1  Hemoglobin A1c     Status: None   Collection Time: 10/05/20 12:00 AM  Result Value Ref Range   Hemoglobin A1C 7.0     ASSESSMENT: 1. 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 2. Type 2 diabetes with A1c 7% 3.  Hyperlipidemia  PLAN:  -  Her thyroid function tests are consistent with appropriate replacement.  She is advised to continue Synthroid 75 mcg p.o. daily before breakfast.     - We discussed about the correct intake of her thyroid hormone, on empty stomach at fasting, with water, separated by at least 30 minutes from breakfast and other medications,  and separated by more than 4 hours from calcium, iron, multivitamins, acid reflux medications (PPIs). -Patient is made aware of the fact that thyroid hormone replacement is needed for life, dose to be adjusted  by periodic monitoring of thyroid function tests.   Regarding her controlled type 2 DM:  --She has responded to low-dose glipizide.  Her previsit labs show A1c was 7% increasing from 6.4%.  She will tolerate higher dose of glipizide.  I discussed and increase her glipizide to 5 mg XL p.o. daily at breakfast.    She is advised to monitor blood glucose when she gets symptoms of hypoglycemia.  Symptoms of hypoglycemia were discussed with her in the clinic. - she acknowledges that there is a room for improvement in her food and drink choices. - Suggestion is made for her to avoid simple carbohydrates  from her diet including Cakes, Sweet Desserts, Ice Cream, Soda (diet and regular), Sweet Tea, Candies, Chips, Cookies, Store Bought Juices, Alcohol in Excess of  1-2 drinks a day, Artificial Sweeteners,  Coffee Creamer, and "Sugar-free" Products, Lemonade. This will help patient to have more stable blood glucose profile and potentially avoid unintended weight gain.   Her blood pressure is controlled at 126/71 currently on amlodipine 5 mg p.o. daily.   She does not have recent lipid panel to review, last LDL was 66.  She is advised to continue atorvastatin 20 mg p.o. daily at bedtime.   Side effects and precautions discussed with her.  Her screening ABI was negative for PAD in April 2022.  Her next check will be in April 2027, or sooner if needed.  She is advised to maintain close follow-up with her PMD.    I spent 30 minutes in the care of the patient today including review of labs from Thyroid Function, CMP, and other relevant labs ; imaging/biopsy records (current and previous including abstractions from other facilities); face-to-face time discussing  her lab results and symptoms, medications doses, her options of short and long term treatment based on the latest standards of care / guidelines;   and documenting the encounter.  Erin Chen  participated in the discussions, expressed  understanding, and voiced agreement with the above plans.  All questions were answered to her satisfaction. she is encouraged to contact clinic should she have any questions or concerns prior to her return visit.   Erin Lloyd, MD [facility] [address] [place],  [address] Ph: [phone] F: [phone]  -  This note was partially dictated with voice recognition software. Similar sounding words can be transcribed inadequately or may not  be corrected upon review.  10/21/2020, 8:50 AM

## 2020-10-21 NOTE — Patient Instructions (Signed)

## 2020-11-02 ENCOUNTER — Ambulatory Visit (INDEPENDENT_AMBULATORY_CARE_PROVIDER_SITE_OTHER): Payer: Medicare Other | Admitting: Pulmonary Disease

## 2020-11-02 ENCOUNTER — Encounter: Payer: Self-pay | Admitting: Pulmonary Disease

## 2020-11-02 ENCOUNTER — Other Ambulatory Visit: Payer: Self-pay

## 2020-11-02 VITALS — BP 136/82 | HR 78 | Temp 98.0°F | Ht 65.0 in | Wt 169.4 lb

## 2020-11-02 DIAGNOSIS — R0609 Other forms of dyspnea: Secondary | ICD-10-CM | POA: Diagnosis not present

## 2020-11-02 DIAGNOSIS — Z9989 Dependence on other enabling machines and devices: Secondary | ICD-10-CM | POA: Diagnosis not present

## 2020-11-02 DIAGNOSIS — G4733 Obstructive sleep apnea (adult) (pediatric): Secondary | ICD-10-CM | POA: Diagnosis not present

## 2020-11-02 NOTE — Progress Notes (Signed)
[facility]  Chief Complaint  Patient presents with   Follow-up    CPAP is working well for patient. Wants an order for new supplies.  PT said she is getting SOB lately with activity.    Past Surgical History:  She  has a past surgical history that includes Tubal ligation and Vaginal hysterectomy.  Past Medical History:  Anemia, GERD, Glaucoma, HLD, COVID 29 January 2019, Hypothyroidism, DM type 2  Constitutional:  BP 136/82 (BP Location: Left Arm, Patient Position: Sitting)   Pulse 78   Temp 98 F (36.7 C)   Ht _0  (1.651 m)   Wt 169 lb 6.4 oz (76.8 kg)   LMP 05/22/2017   SpO2 99%   BMI 28.19 kg/m   Brief Summary:  Erin Chen is a 70 y.o. female with obstructive sleep apnea.      Subjective:   She reports receiving new CPAP machine.  Pressure setting is comfortable.  She has full face mask.  No issues with sinus congestion or dry mouth.  She needs new mask.  Her current mask liner is leaking.  She feels rested.  She gets episodes of feeling short of breath when walking up stairs.  She has to rest for a few minutes.  She is not having cough, wheeze, or sputum.  She does get a heavy feeling in her chest sometimes, but does away after few seconds.  Not having dizziness, palpitations, or leg swelling.  She reports discussing this with her PCP and having testing of her heart function within the past few months.  She also reports having a chest xray done within the past several months.  Physical Exam:   Appearance - well kempt   ENMT - no sinus tenderness, no oral exudate, no LAN, Mallampati 3 airway, no stridor  Respiratory - equal breath sounds bilaterally, no wheezing or rales  CV - s1s2 regular rate and rhythm, no murmurs  Ext - no clubbing, no edema  Skin - no rashes  Psych - normal mood and affect    Sleep Tests:  CPAP 03/09/20 to 04/07/20 >> used on 19 of 30 nights with average 4 hrs 23 min.  Average AHI 4.3  with CPAP 5 cm H2O.  Social History:  She  reports that she has never smoked. She has never used smokeless tobacco. She reports that she does not drink alcohol and does not use drugs.  Family History:  Her family history includes Diabetes in her mother; Heart attack in her father.     Assessment/Plan:   Obstructive sleep apnea. - she reports compliance with CPAP and benefit from therapy - she uses [facility] in [place] for her DME - continue auto CPAP 5 to 15 cm H2O - will see if we can get a copy of her CPAP download and will call her with results - will arrange for new CPAP mask and supplies  Dyspnea on exertion. - discussed different potential causes of this - she doesn't feel this interferes with her activity level too much and would prefer to defer further assessment at this time - advised her to call or d/w PCP if symptoms progress  Hypothyroidism, Diabetes mellitus type 2. - followed by Dr. Glade Lloyd with [facility]  Time Spent Involved in Patient Care on Day of Examination:  31 minutes  Follow up:   Patient Instructions  Will arrange for new CPAP mask and supplies  Follow up in 1 year  Medication List:   Allergies as of 11/02/2020       Reactions   Levothyroxine    Synthroid [levothyroxine Sodium]         Medication List        Accurate as of November 02, 2020  9:09 AM. If you have any questions, ask your nurse or doctor.          Accu-Chek Guide Me w/Device Kit 1 Piece by Does not apply route as directed.   Accu-Chek Guide test strip Generic drug: glucose blood CHECK GLUCOSE ONCE DAILY   atorvastatin 20 MG tablet Commonly known as: LIPITOR Take 1 tablet by mouth daily.   CALCIUM 500 PO Take 1 tablet by mouth daily in the afternoon.   famotidine 20 MG tablet Commonly known as: PEPCID Take 20 mg by mouth 2 (two) times daily.   glipiZIDE 5 MG 24 hr tablet Commonly known as: GLUCOTROL XL Take 1 tablet (5  mg total) by mouth daily with breakfast.   lansoprazole 30 MG capsule Commonly known as: PREVACID Take 30 mg by mouth every morning.   Synthroid 75 MCG tablet Generic drug: levothyroxine Take 1 tablet (75 mcg total) by mouth daily before breakfast.   vitamin B-12 1000 MCG tablet Commonly known as: CYANOCOBALAMIN Take 1,000 mcg by mouth daily.   Vitamin D3 50 MCG (2000 UT) Tabs Take by mouth daily.        Signature:  Chesley Mires, MD [place] Pager - [phone] 11/02/2020, 9:09 AM

## 2020-11-02 NOTE — Patient Instructions (Signed)
Will arrange for new CPAP mask and supplies  Follow up in 1 year 

## 2020-11-03 ENCOUNTER — Telehealth: Payer: Self-pay

## 2020-11-03 NOTE — Telephone Encounter (Signed)
Left a message requesting a return call to the office. 

## 2020-11-03 NOTE — Telephone Encounter (Signed)
Patient left a VM stating that her synthroid is making her itch. She said she thinks she needs to switch. She is asking for a call back

## 2020-11-04 ENCOUNTER — Other Ambulatory Visit: Payer: Self-pay | Admitting: "Endocrinology

## 2020-11-04 MED ORDER — TIROSINT 75 MCG PO CAPS: 75.0000 ug | ORAL_CAPSULE | Freq: Every day | ORAL | 0 refills | Status: DC

## 2020-11-04 NOTE — Telephone Encounter (Signed)
Pt made aware

## 2020-11-04 NOTE — Telephone Encounter (Signed)
Pt stated she has not taken synthroid for the past 2 days and symptoms have improved. Requested a Rx for a replacement medication. Pt does have listed allergy to levothyroxine and synthroid.

## 2020-11-04 NOTE — Telephone Encounter (Signed)
Left a message requesting a return call to the office. 

## 2021-01-19 NOTE — Telephone Encounter (Signed)
Pt called and states she has stopped taking the Tirosint because it was causing her to itch as well and the itching has stopped since she stopped taking it.

## 2021-01-19 NOTE — Telephone Encounter (Signed)
Made pt aware, voiced understanding.

## 2021-01-19 NOTE — Telephone Encounter (Signed)
Left a message requesting a return call to the office. 

## 2021-01-19 NOTE — Telephone Encounter (Signed)
Discussed with pt, understanding voiced. Asked if you had a recommendation for medication to help with itching or if OTC benadryl or similar ok to take.

## 2021-02-01 ENCOUNTER — Telehealth: Payer: Self-pay | Admitting: "Endocrinology

## 2021-02-01 NOTE — Telephone Encounter (Signed)
Please see message.  Please advise. 

## 2021-02-01 NOTE — Telephone Encounter (Signed)
Called pharmacy and spoke to [place] and gave her the message from Dr. Fransico Him. Pharmacist stated that the patient was concerned about the price. They will let the patient know.

## 2021-02-01 NOTE — Telephone Encounter (Signed)
[facility] called and would like to know if Tirosint can be switched to generic.   Call back # is [phone]  Pt said she can not take synthroid generic. Something about the dye. Pharmacy is saying Euthyrox would work if that is okay.

## 2021-04-21 ENCOUNTER — Ambulatory Visit: Payer: Medicare Other | Admitting: "Endocrinology

## 2021-04-29 ENCOUNTER — Ambulatory Visit: Payer: Medicare Other | Admitting: "Endocrinology

## 2021-05-26 ENCOUNTER — Other Ambulatory Visit (HOSPITAL_COMMUNITY): Payer: Self-pay | Admitting: Internal Medicine

## 2021-05-26 DIAGNOSIS — M25511 Pain in right shoulder: Secondary | ICD-10-CM

## 2021-06-08 ENCOUNTER — Ambulatory Visit (HOSPITAL_COMMUNITY)
Admission: RE | Admit: 2021-06-08 | Discharge: 2021-06-08 | Disposition: A | Discharge status: Home / Self Care | Payer: Medicare Other | Source: Ambulatory Visit | Attending: Internal Medicine | Admitting: Internal Medicine

## 2021-06-08 ENCOUNTER — Ambulatory Visit (INDEPENDENT_AMBULATORY_CARE_PROVIDER_SITE_OTHER): Payer: Medicare Other | Admitting: "Endocrinology

## 2021-06-08 ENCOUNTER — Encounter: Payer: Self-pay | Admitting: "Endocrinology

## 2021-06-08 VITALS — BP 112/74 | HR 80 | Ht 65.0 in | Wt 165.6 lb

## 2021-06-08 DIAGNOSIS — E119 Type 2 diabetes mellitus without complications: Secondary | ICD-10-CM

## 2021-06-08 DIAGNOSIS — E782 Mixed hyperlipidemia: Secondary | ICD-10-CM

## 2021-06-08 DIAGNOSIS — E039 Hypothyroidism, unspecified: Secondary | ICD-10-CM

## 2021-06-08 DIAGNOSIS — M25511 Pain in right shoulder: Secondary | ICD-10-CM | POA: Diagnosis present

## 2021-06-08 LAB — POCT GLYCOSYLATED HEMOGLOBIN (HGB A1C): HbA1c, POC (controlled diabetic range): 6.8 % (ref 0.0–7.0)

## 2021-06-08 NOTE — Progress Notes (Signed)
06/08/2021  Endocrinology follow-up note  HPI   Erin Chen is a 71 y.o.-year-old female, here to follow up for hypothyroidism, type 2 diabetes.   She has hx of hypothyroidism with intolerance to levothyroxine.  She is currently on Tirosint 75 mcg p.o. daily before breakfast, continues to tolerate very well.   - She reports better compliance with her medications this time.  She has steady weight, denies palpitations, tremors, nor heat intolerance.      She denies family history of thyroid dysfunction. She denies personal history of goiter. She denies family history of thyroid cancer. She denies history of antithyroid therapy nor radiation to the neck.   She had problems swallowing Metformin, was initiated on low-dose glipizide during her last visit.  Her point-of-care A1c is 6.8% today.    She is on glipizide 5 mg XL p.o. daily at breakfast.     Allergies as of 06/08/2021       Reactions   Levothyroxine    Synthroid [levothyroxine Sodium]         Medication List        Accurate as of Jun 08, 2021  5:43 PM. If you have any questions, ask your nurse or doctor.          STOP taking these medications    famotidine 20 MG tablet Commonly known as: PEPCID Stopped by: Glade Lloyd, MD       TAKE these medications    Accu-Chek Guide Me w/Device Kit 1 Piece by Does not apply route as directed.   Accu-Chek Guide test strip Generic drug: glucose blood CHECK GLUCOSE ONCE DAILY   atorvastatin 20 MG tablet Commonly known as: LIPITOR Take 1 tablet by mouth daily.   CALCIUM 500 PO Take 1 tablet by mouth daily in the afternoon.   glipiZIDE 5 MG 24 hr tablet Commonly known as: GLUCOTROL XL Take 1 tablet (5 mg total) by mouth daily with breakfast.   lansoprazole 30 MG capsule Commonly known as: PREVACID Take 30 mg by mouth every morning.   Tirosint 75 MCG Caps Generic drug: Levothyroxine Sodium Take 1 capsule (75 mcg total) by mouth daily before breakfast.    vitamin B-12 1000 MCG tablet Commonly known as: CYANOCOBALAMIN Take 1,000 mcg by mouth daily.   Vitamin D3 50 MCG (2000 UT) Tabs Take by mouth daily.         PE: BP 112/74   Pulse 80   Ht '5\' 5"'  (1.651 m)   Wt 165 lb 9.6 oz (75.1 kg)   LMP 05/22/2017   BMI 27.56 kg/m  Wt Readings from Last 3 Encounters:  06/08/21 165 lb 9.6 oz (75.1 kg)  11/02/20 169 lb 6.4 oz (76.8 kg)  10/21/20 168 lb 3.2 oz (76.3 kg)     April 06, 2017 labs showed: Free T4 within normal limits at 1.4 2, TSH within normal limits 0.4 - RECENT LABS SHOWING A1C OF 7.1% INCREASING FROM 6.6%.   Recent Results (from the past 2160 hour(s))  HgB A1c     Status: None   Collection Time: 06/08/21 11:03 AM  Result Value Ref Range   Hemoglobin A1C     HbA1c POC (<> result, manual entry)     HbA1c, POC (prediabetic range)     HbA1c, POC (controlled diabetic range) 6.8 0.0 - 7.0 %    ASSESSMENT: 1. 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 2. Type 2 diabetes with A1c 7% 3.  Hyperlipidemia  PLAN:  -  Her thyroid function tests  are consistent with appropriate replacement.  She is advised to continue Tirosint 75 mcg p.o. daily before breakfast.     - We discussed about the correct intake of her thyroid hormone, on empty stomach at fasting, with water, separated by at least 30 minutes from breakfast and other medications,  and separated by more than 4 hours from calcium, iron, multivitamins, acid reflux medications (PPIs). -Patient is made aware of the fact that thyroid hormone replacement is needed for life, dose to be adjusted by periodic monitoring of thyroid function tests.    Regarding her controlled type 2 DM:  --She has responded to low-dose glipizide.  Her point-of-care A1c is 6.8%.  She is advised to continue glipizide 5 mg XL p.o. daily at breakfast.  She is advised to monitor blood glucose fasting 3 times a week.     Symptoms of hypoglycemia were discussed with her in the clinic. - she  acknowledges that there is a room for improvement in her food and drink choices. - Suggestion is made for her to avoid simple carbohydrates  from her diet including Cakes, Sweet Desserts, Ice Cream, Soda (diet and regular), Sweet Tea, Candies, Chips, Cookies, Store Bought Juices, Alcohol in Excess of  1-2 drinks a day, Artificial Sweeteners,  Coffee Creamer, and "Sugar-free" Products, Lemonade. This will help patient to have more stable blood glucose profile and potentially avoid unintended weight gain.   Her blood pressure is controlled at 112/74 currently on amlodipine 5 mg p.o. daily.   She does not have recent lipid panel to review, last LDL was 66.  She is advised to continue atorvastatin 20 mg p.o. daily at bedtime.   Side effects and precautions discussed with her.  Her screening ABI was negative for PAD in April 2022.  Her next check will be in April 2027, or sooner if needed.  She is advised to maintain close follow-up with her PMD.   I spent 23 minutes in the care of the patient today including review of labs from Thyroid Function, CMP, and other relevant labs ; imaging/biopsy records (current and previous including abstractions from other facilities); face-to-face time discussing  her lab results and symptoms, medications doses, her options of short and long term treatment based on the latest standards of care / guidelines;   and documenting the encounter.  [name]  participated in the discussions, expressed understanding, and voiced agreement with the above plans.  All questions were answered to her satisfaction. she is encouraged to contact clinic should she have any questions or concerns prior to her return visit.   Glade Lloyd, MD [facility] [address] [place], [place] [address] Ph: [phone] F: [phone]  -  This note was partially dictated with voice recognition software. Similar sounding words can be  transcribed inadequately or may not  be corrected upon review.  06/08/2021, 5:43 PM

## 2021-06-08 NOTE — Patient Instructions (Signed)

## 2021-06-22 ENCOUNTER — Other Ambulatory Visit (HOSPITAL_COMMUNITY): Payer: Self-pay | Admitting: Internal Medicine

## 2021-06-22 DIAGNOSIS — Z1231 Encounter for screening mammogram for malignant neoplasm of breast: Secondary | ICD-10-CM

## 2021-07-07 ENCOUNTER — Other Ambulatory Visit: Payer: Self-pay | Admitting: "Endocrinology

## 2021-07-27 ENCOUNTER — Other Ambulatory Visit: Payer: Self-pay | Admitting: "Endocrinology

## 2021-07-27 DIAGNOSIS — E119 Type 2 diabetes mellitus without complications: Secondary | ICD-10-CM

## 2021-07-30 ENCOUNTER — Ambulatory Visit (HOSPITAL_COMMUNITY): Payer: Medicare Other

## 2021-08-11 ENCOUNTER — Ambulatory Visit (HOSPITAL_COMMUNITY)
Admission: RE | Admit: 2021-08-11 | Discharge: 2021-08-11 | Disposition: A | Discharge status: Home / Self Care | Payer: Medicare Other | Source: Ambulatory Visit | Attending: Internal Medicine | Admitting: Internal Medicine

## 2021-08-11 DIAGNOSIS — Z1231 Encounter for screening mammogram for malignant neoplasm of breast: Secondary | ICD-10-CM | POA: Diagnosis present

## 2021-11-22 ENCOUNTER — Encounter: Payer: Self-pay | Admitting: Pulmonary Disease

## 2021-11-22 ENCOUNTER — Ambulatory Visit (INDEPENDENT_AMBULATORY_CARE_PROVIDER_SITE_OTHER): Payer: Medicare Other | Admitting: Pulmonary Disease

## 2021-11-22 VITALS — BP 132/80 | HR 74 | Temp 98.2°F | Ht 65.0 in | Wt 168.4 lb

## 2021-11-22 DIAGNOSIS — G4733 Obstructive sleep apnea (adult) (pediatric): Secondary | ICD-10-CM | POA: Diagnosis not present

## 2021-11-22 NOTE — Progress Notes (Signed)
[facility]  Chief Complaint  Patient presents with   Follow-up    Cpap working well but needs new supplies - waiting on compliance report from [facility]    Past Surgical History:  She  has a past surgical history that includes Tubal ligation and Vaginal hysterectomy.  Past Medical History:  Anemia, GERD, Glaucoma, HLD, COVID 29 January 2019, Hypothyroidism, DM type 2  Constitutional:  BP 132/80 (BP Location: Left Arm, Patient Position: Sitting)   Pulse 74   Temp 98.2 F (36.8 C)   Ht _0  (1.651 m)   Wt 168 lb 6.4 oz (76.4 kg)   LMP 05/22/2017   SpO2 99% Comment: ra  BMI 28.02 kg/m   Brief Summary:  Erin Chen is a 71 y.o. female with obstructive sleep apnea.      Subjective:   She feels that CPAP helps.  She uses about 4 or 5 nights per week.  She likes watching TV in bed on weekends, and then falls asleep before putting her mask on.  Has a full face mask.  No issues with mask fit.  Gets mild mouth dryness.  Physical Exam:   Appearance - well kempt   ENMT - no sinus tenderness, no oral exudate, no LAN, Mallampati 3 airway, no stridor  Respiratory - equal breath sounds bilaterally, no wheezing or rales  CV - s1s2 regular rate and rhythm, no murmurs  Ext - no clubbing, no edema  Skin - no rashes  Psych - normal mood and affect     Sleep Tests:  CPAP 03/09/20 to 04/07/20 >> used on 19 of 30 nights with average 4 hrs 23 min.  Average AHI 4.3 with CPAP 5 cm H2O.  Social History:  She  reports that she has never smoked. She has never used smokeless tobacco. She reports that she does not drink alcohol and does not use drugs.  Family History:  Her family history includes Diabetes in her mother; Heart attack in her father.     Assessment/Plan:   Obstructive sleep apnea. - she reports compliance with CPAP and benefit from therapy - she uses [facility] in [place] for her DME - current CPAP  ordered in March 2022 - continue auto CPAP 5 to 15 cm H2O - will call her with results of her CPAP report - will arrange for new CPAP mask and supplies  Hypothyroidism, Diabetes mellitus type 2. - followed by Dr. Glade Lloyd with [facility]  Time Spent Involved in Patient Care on Day of Examination:  17 minutes  Follow up:   Patient Instructions  Will arrange for replacement CPAP supplies from [facility] and call you with the results of your CPAP report  Follow up in 1 year  Medication List:   Allergies as of 11/22/2021       Reactions   Levothyroxine    Synthroid [levothyroxine Sodium]         Medication List        Accurate as of November 22, 2021  8:58 AM. If you have any questions, ask your nurse or doctor.          Accu-Chek Guide Me w/Device Kit 1 Piece by Does not apply route as directed.   Accu-Chek Guide test strip Generic drug: glucose blood CHECK GLUCOSE ONCE DAILY   atorvastatin 20 MG tablet Commonly known as: LIPITOR Take 1 tablet by mouth daily.   CALCIUM 500 PO Take 1 tablet by mouth  daily in the afternoon.   cyanocobalamin 1000 MCG tablet Commonly known as: VITAMIN B12 Take 1,000 mcg by mouth daily.   glipiZIDE 5 MG 24 hr tablet Commonly known as: GLUCOTROL XL Take 1 tablet by mouth once daily with breakfast   lansoprazole 30 MG capsule Commonly known as: PREVACID Take 30 mg by mouth every morning.   Tirosint 75 MCG Caps Generic drug: Levothyroxine Sodium TAKE 1 CAPSULE BY MOUTH ONCE DAILY BEFORE BREAKFAST   Vitamin D3 50 MCG (2000 UT) Tabs Take by mouth daily.        Signature:  Chesley Mires, MD [place] Pager - [phone] 11/22/2021, 8:58 AM

## 2021-11-22 NOTE — Patient Instructions (Signed)
Will arrange for replacement CPAP supplies from [facility] and call you with the results of your CPAP report  Follow up in 1 year

## 2021-12-08 ENCOUNTER — Ambulatory Visit (INDEPENDENT_AMBULATORY_CARE_PROVIDER_SITE_OTHER): Payer: Medicare Other | Admitting: "Endocrinology

## 2021-12-08 ENCOUNTER — Encounter: Payer: Self-pay | Admitting: "Endocrinology

## 2021-12-08 VITALS — BP 130/78 | HR 76 | Ht 65.0 in | Wt 167.4 lb

## 2021-12-08 DIAGNOSIS — E119 Type 2 diabetes mellitus without complications: Secondary | ICD-10-CM | POA: Diagnosis not present

## 2021-12-08 DIAGNOSIS — E782 Mixed hyperlipidemia: Secondary | ICD-10-CM

## 2021-12-08 DIAGNOSIS — E039 Hypothyroidism, unspecified: Secondary | ICD-10-CM

## 2021-12-08 LAB — POCT GLYCOSYLATED HEMOGLOBIN (HGB A1C): HbA1c, POC (controlled diabetic range): 6.3 % (ref 0.0–7.0)

## 2021-12-08 MED ORDER — ACCU-CHEK SOFTCLIX LANCETS MISC: 2 refills | Status: DC

## 2021-12-08 MED ORDER — TIROSINT 75 MCG PO CAPS: ORAL_CAPSULE | ORAL | 2 refills | Status: DC

## 2021-12-08 NOTE — Patient Instructions (Signed)
                                     Advice for Weight Management  -For most of us the best way to lose weight is by diet management. Generally speaking, diet management means consuming less calories intentionally which over time brings about progressive weight loss.  This can be achieved more effectively by avoiding ultra processed carbohydrates, processed meats, unhealthy fats.    It is critically important to know your numbers: how much calorie you are consuming and how much calorie you need. More importantly, our carbohydrates sources should be unprocessed naturally occurring  complex starch food items.  It is always important to balance nutrition also by  appropriate intake of proteins (mainly plant-based), healthy fats/oils, plenty of fruits and vegetables.   -The American College of Lifestyle Medicine (ACL M) recommends nutrition derived mostly from Whole Food, Plant Predominant Sources example an apple instead of applesauce or apple pie. Eat Plenty of vegetables, Mushrooms, fruits, Legumes, Whole Grains, Nuts, seeds in lieu of processed meats, processed snacks/pastries red meat, poultry, eggs.  Use only water or unsweetened tea for hydration.  The College also recommends the need to stay away from risky substances including alcohol, smoking; obtaining 7-9 hours of restorative sleep, at least 150 minutes of moderate intensity exercise weekly, importance of healthy social connections, and being mindful of stress and seek help when it is overwhelming.    -Sticking to a routine mealtime to eat 3 meals a day and avoiding unnecessary snacks is shown to have a big role in weight control. Under normal circumstances, the only time we burn stored energy is when we are hungry, so allow  some hunger to take place- hunger means no food between appropriate meal times, only water.  It is not advisable to starve.   -It is better to avoid simple carbohydrates including:  Cakes, Sweet Desserts, Ice Cream, Soda (diet and regular), Sweet Tea, Candies, Chips, Cookies, Store Bought Juices, Alcohol in Excess of  1-2 drinks a day, Lemonade,  Artificial Sweeteners, Doughnuts, Coffee Creamers, "Sugar-free" Products, etc, etc.  This is not a complete list.....    -Consulting with certified diabetes educators is proven to provide you with the most accurate and current information on diet.  Also, you may be  interested in discussing diet options/exchanges , we can schedule a visit with Erin Chen, RDN, CDE for individualized nutrition education.  -Exercise: If you are able: 30 -60 minutes a day ,4 days a week, or 150 minutes of moderate intensity exercise weekly.    The longer the better if tolerated.  Combine stretch, strength, and aerobic activities.  If you were told in the past that you have high risk for cardiovascular diseases, or if you are currently symptomatic, you may seek evaluation by your heart doctor prior to initiating moderate to intense exercise programs.                                  Additional Care Considerations for Diabetes/Prediabetes   -Diabetes  is a chronic disease.  The most important care consideration is regular follow-up with your diabetes care provider with the goal being avoiding or delaying its complications and to take advantage of advances in medications and technology.  If appropriate actions are taken early enough, type 2 diabetes can even be   reversed.  Seek information from the right source.  - Whole Food, Plant Predominant Nutrition is highly recommended: Eat Plenty of vegetables, Mushrooms, fruits, Legumes, Whole Grains, Nuts, seeds in lieu of processed meats, processed snacks/pastries red meat, poultry, eggs as recommended by American College of  Lifestyle Medicine (ACLM).  -Type 2 diabetes is known to coexist with other important comorbidities such as high blood pressure and high cholesterol.  It is critical to control not only the  diabetes but also the high blood pressure and high cholesterol to minimize and delay the risk of complications including coronary artery disease, stroke, amputations, blindness, etc.  The good news is that this diet recommendation for type 2 diabetes is also very helpful for managing high cholesterol and high blood blood pressure.  - Studies showed that people with diabetes will benefit from a class of medications known as ACE inhibitors and statins.  Unless there are specific reasons not to be on these medications, the standard of care is to consider getting one from these groups of medications at an optimal doses.  These medications are generally considered safe and proven to help protect the heart and the kidneys.    - People with diabetes are encouraged to initiate and maintain regular follow-up with eye doctors, foot doctors, dentists , and if necessary heart and kidney doctors.     - It is highly recommended that people with diabetes quit smoking or stay away from smoking, and get yearly  flu vaccine and pneumonia vaccine at least every 5 years.  See above for additional recommendations on exercise, sleep, stress management , and healthy social connections.      

## 2021-12-08 NOTE — Progress Notes (Signed)
06/08/2021  Endocrinology follow-up note  HPI   Erin Chen is a 71 y.o.-year-old female, here to follow up for hypothyroidism, type 2 diabetes.   She has hx of hypothyroidism with intolerance to levothyroxine.  She is currently on Tirosint 75 mcg p.o. daily before breakfast, continues to tolerate very well.  Her previsit labs are not available to review. - She reports better compliance with her medications this time.  She has steady weight, denies palpitations, tremors, nor heat intolerance.      She denies family history of thyroid dysfunction. She denies personal history of goiter. She denies family history of thyroid cancer. She denies history of antithyroid therapy nor radiation to the neck.   She has had problems swallowing metformin, was initiated on low-dose glipizide during her last visit.  Her point-of-care A1c is 6.3% today.    She is on glipizide 5 mg XL p.o. daily at breakfast.     Allergies as of 06/08/2021       Reactions   Levothyroxine    Synthroid [levothyroxine Sodium]         Medication List        Accurate as of Jun 08, 2021  5:43 PM. If you have any questions, ask your nurse or doctor.          STOP taking these medications    famotidine 20 MG tablet Commonly known as: PEPCID Stopped by: Glade Lloyd, MD       TAKE these medications    Accu-Chek Guide Me w/Device Kit 1 Piece by Does not apply route as directed.   Accu-Chek Guide test strip Generic drug: glucose blood CHECK GLUCOSE ONCE DAILY   atorvastatin 20 MG tablet Commonly known as: LIPITOR Take 1 tablet by mouth daily.   CALCIUM 500 PO Take 1 tablet by mouth daily in the afternoon.   glipiZIDE 5 MG 24 hr tablet Commonly known as: GLUCOTROL XL Take 1 tablet (5 mg total) by mouth daily with breakfast.   lansoprazole 30 MG capsule Commonly known as: PREVACID Take 30 mg by mouth every morning.   Tirosint 75 MCG Caps Generic drug: Levothyroxine Sodium Take 1 capsule  (75 mcg total) by mouth daily before breakfast.   vitamin B-12 1000 MCG tablet Commonly known as: CYANOCOBALAMIN Take 1,000 mcg by mouth daily.   Vitamin D3 50 MCG (2000 UT) Tabs Take by mouth daily.         PE: BP 112/74   Pulse 80   Ht _0  (1.651 m)   Wt 165 lb 9.6 oz (75.1 kg)   LMP 05/22/2017   BMI 27.56 kg/m  Wt Readings from Last 3 Encounters:  06/08/21 165 lb 9.6 oz (75.1 kg)  11/02/20 169 lb 6.4 oz (76.8 kg)  10/21/20 168 lb 3.2 oz (76.3 kg)     April 06, 2017 labs showed: Free T4 within normal limits at 1.4 2, TSH within normal limits 0.4 - RECENT LABS SHOWING A1C OF 7.1% INCREASING FROM 6.6%.   Recent Results (from the past 2160 hour(s))  HgB A1c     Status: None   Collection Time: 06/08/21 11:03 AM  Result Value Ref Range   Hemoglobin A1C     HbA1c POC (<> result, manual entry)     HbA1c, POC (prediabetic range)     HbA1c, POC (controlled diabetic range) 6.8 0.0 - 7.0 %    ASSESSMENT: 1. 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 2. Type 2 diabetes with A1c  6.3% 3.  Hyperlipidemia-controlled  PLAN:  -  Her thyroid function tests are not available to review.  She is advised to continue Tirosint 75 mcg p.o. daily before breakfast.     - We discussed about the correct intake of her thyroid hormone, on empty stomach at fasting, with water, separated by at least 30 minutes from breakfast and other medications,  and separated by more than 4 hours from calcium, iron, multivitamins, acid reflux medications (PPIs). -Patient is made aware of the fact that thyroid hormone replacement is needed for life, dose to be adjusted by periodic monitoring of thyroid function tests.   Regarding her controlled type 2 DM:  --She has responded to low-dose glipizide.  Her point-of-care A1c is 6.3%.  She is advised to continue glipizide 5 mg XL p.o. daily at breakfast.  She is advised to monitor blood glucose fasting 3 times a week.     Symptoms of hypoglycemia were  discussed with her in the clinic.  - she acknowledges that there is a room for improvement in her food and drink choices. - Suggestion is made for her to avoid simple carbohydrates  from her diet including Cakes, Sweet Desserts, Ice Cream, Soda (diet and regular), Sweet Tea, Candies, Chips, Cookies, Store Bought Juices, Alcohol in Excess of  1-2 drinks a day, Artificial Sweeteners,  Coffee Creamer, and "Sugar-free" Products, Lemonade. This will help patient to have more stable blood glucose profile and potentially avoid unintended weight gain.   Her blood pressure is controlled at 130/78  currently on amlodipine 5 mg p.o. daily.   She does not have recent lipid panel to review, last LDL was 66.  She is advised to continue atorvastatin 20 mg p.o. daily at bedtime.   Side effects and precautions discussed with her.  Her screening ABI was negative for PAD in April 2022.  She is advised to maintain close follow-up with her PMD.  I spent 26 minutes in the care of the patient today including review of labs from Thyroid Function, CMP, and other relevant labs ; imaging/biopsy records (current and previous including abstractions from other facilities); face-to-face time discussing  her lab results and symptoms, medications doses, her options of short and long term treatment based on the latest standards of care / guidelines;   and documenting the encounter.  [name]  participated in the discussions, expressed understanding, and voiced agreement with the above plans.  All questions were answered to her satisfaction. she is encouraged to contact clinic should she have any questions or concerns prior to her return visit.   Glade Lloyd, MD [facility] [address] [place], [place] [address] Ph: [phone] F: [phone]  -  This note was partially dictated with voice recognition software. Similar sounding words can be transcribed  inadequately or may not  be corrected upon review.  06/08/2021, 5:43 PM

## 2022-01-06 ENCOUNTER — Other Ambulatory Visit: Payer: Self-pay | Admitting: "Endocrinology

## 2022-01-06 DIAGNOSIS — E119 Type 2 diabetes mellitus without complications: Secondary | ICD-10-CM

## 2022-01-26 ENCOUNTER — Other Ambulatory Visit: Payer: Self-pay

## 2022-01-26 MED ORDER — ACCU-CHEK SOFTCLIX LANCETS MISC: 2 refills | Status: DC

## 2022-01-27 ENCOUNTER — Other Ambulatory Visit: Payer: Self-pay

## 2022-01-27 DIAGNOSIS — E119 Type 2 diabetes mellitus without complications: Secondary | ICD-10-CM

## 2022-01-27 MED ORDER — ACCU-CHEK GUIDE VI STRP: ORAL_STRIP | 2 refills | Status: DC

## 2022-02-22 ENCOUNTER — Other Ambulatory Visit: Payer: Self-pay

## 2022-02-22 DIAGNOSIS — E039 Hypothyroidism, unspecified: Secondary | ICD-10-CM

## 2022-02-22 MED ORDER — TIROSINT 75 MCG PO CAPS: ORAL_CAPSULE | ORAL | 2 refills | Status: DC

## 2022-03-10 ENCOUNTER — Ambulatory Visit: Payer: Medicare Other | Admitting: "Endocrinology

## 2022-03-14 ENCOUNTER — Ambulatory Visit (INDEPENDENT_AMBULATORY_CARE_PROVIDER_SITE_OTHER): Payer: Medicare Other | Admitting: "Endocrinology

## 2022-03-14 ENCOUNTER — Encounter: Payer: Self-pay | Admitting: "Endocrinology

## 2022-03-14 VITALS — BP 128/74 | HR 68 | Ht 65.0 in | Wt 168.2 lb

## 2022-03-14 DIAGNOSIS — E119 Type 2 diabetes mellitus without complications: Secondary | ICD-10-CM

## 2022-03-14 DIAGNOSIS — E039 Hypothyroidism, unspecified: Secondary | ICD-10-CM | POA: Diagnosis not present

## 2022-03-14 DIAGNOSIS — E782 Mixed hyperlipidemia: Secondary | ICD-10-CM

## 2022-03-14 NOTE — Progress Notes (Signed)
06/08/2021  Endocrinology follow-up note  HPI   Erin Chen is a 72 y.o.-year-old female, here to follow up for hypothyroidism, type 2 diabetes.   She has hx of hypothyroidism with intolerance to levothyroxine.  She is currently on Tirosint 75 mcg p.o. daily before breakfast.  She continues to tolerate this regimen very well.   Her previsit labs are consistent with appropriate replacement.   - She reports better compliance with her medications this time.  She has steady weight, denies palpitations, tremors, nor heat intolerance.      She denies family history of thyroid dysfunction. She denies personal history of goiter. She denies family history of thyroid cancer. She denies history of antithyroid therapy nor radiation to the neck.   She has had problems swallowing metformin, was initiated on low-dose glipizide during her last visit.     Her previsit labs showed A1c of 7.3% increasing from 6.3%.  She remains on glipizide 5 mg XL p.o. daily at breakfast.     Allergies as of 06/08/2021       Reactions   Levothyroxine    Synthroid [levothyroxine Sodium]         Medication List        Accurate as of Jun 08, 2021  5:43 PM. If you have any questions, ask your nurse or doctor.          STOP taking these medications    famotidine 20 MG tablet Commonly known as: PEPCID Stopped by: Glade Lloyd, MD       TAKE these medications    Accu-Chek Guide Me w/Device Kit 1 Piece by Does not apply route as directed.   Accu-Chek Guide test strip Generic drug: glucose blood CHECK GLUCOSE ONCE DAILY   atorvastatin 20 MG tablet Commonly known as: LIPITOR Take 1 tablet by mouth daily.   CALCIUM 500 PO Take 1 tablet by mouth daily in the afternoon.   glipiZIDE 5 MG 24 hr tablet Commonly known as: GLUCOTROL XL Take 1 tablet (5 mg total) by mouth daily with breakfast.   lansoprazole 30 MG capsule Commonly known as: PREVACID Take 30 mg by mouth every morning.    Tirosint 75 MCG Caps Generic drug: Levothyroxine Sodium Take 1 capsule (75 mcg total) by mouth daily before breakfast.   vitamin B-12 1000 MCG tablet Commonly known as: CYANOCOBALAMIN Take 1,000 mcg by mouth daily.   Vitamin D3 50 MCG (2000 UT) Tabs Take by mouth daily.         PE: BP 112/74   Pulse 80   Ht '5\' 5"'$  (1.651 m)   Wt 165 lb 9.6 oz (75.1 kg)   LMP 05/22/2017   BMI 27.56 kg/m  Wt Readings from Last 3 Encounters:  06/08/21 165 lb 9.6 oz (75.1 kg)  11/02/20 169 lb 6.4 oz (76.8 kg)  10/21/20 168 lb 3.2 oz (76.3 kg)     April 06, 2017 labs showed: Free T4 within normal limits at 1.4 2, TSH within normal limits 0.4 - RECENT LABS SHOWING A1C OF 7.1% INCREASING FROM 6.6%.   Recent Results (from the past 2160 hour(s))  HgB A1c     Status: None   Collection Time: 06/08/21 11:03 AM  Result Value Ref Range   Hemoglobin A1C     HbA1c POC (<> result, manual entry)     HbA1c, POC (prediabetic range)     HbA1c, POC (controlled diabetic range) 6.8 0.0 - 7.0 %    ASSESSMENT: 1. Hypothyroidism from Andrew  Thyroiditis. 2. Type 2 diabetes with A1c  6.3% 3.  Hyperlipidemia-controlled  PLAN:  -  Her thyroid function tests are consistent with appropriate replacement.  She is advised to continue  Tirosint 75 mcg p.o. daily before breakfast.     - We discussed about the correct intake of her thyroid hormone, on empty stomach at fasting, with water, separated by at least 30 minutes from breakfast and other medications,  and separated by more than 4 hours from calcium, iron, multivitamins, acid reflux medications (PPIs). -Patient is made aware of the fact that thyroid hormone replacement is needed for life, dose to be adjusted by periodic monitoring of thyroid function tests.    Regarding her controlled type 2 DM:  --She has responded to low-dose glipizide.  Her previsit labs show A1c of 7.3%.  She is advised to continue on glipizide 5 mg XL p.o. daily at breakfast.   She did not tolerate metformin in the past.  She is still a good candidate for lifestyle medicine.  - she acknowledges that there is a room for improvement in her food and drink choices. - Suggestion is made for her to avoid simple carbohydrates  from her diet including Cakes, Sweet Desserts, Ice Cream, Soda (diet and regular), Sweet Tea, Candies, Chips, Cookies, Store Bought Juices, Alcohol in Excess of  1-2 drinks a day, Artificial Sweeteners,  Coffee Creamer, and "Sugar-free" Products, Lemonade. This will help patient to have more stable blood glucose profile and potentially avoid unintended weight gain.   Her blood pressure is controlled at 128/74,  currently on amlodipine 5 mg p.o. daily.   She does not have recent lipid panel to review, last LDL was 66.  She is advised to continue atorvastatin 20 mg p.o. daily at bedtime.     Side effects and precautions discussed with her.  Her screening ABI was negative for PAD in April 2022.  She is advised to maintain close follow-up with her PMD.   I spent 21  minutes in the care of the patient today including review of labs from Thyroid Function, CMP, and other relevant labs ; imaging/biopsy records (current and previous including abstractions from other facilities); face-to-face time discussing  her lab results and symptoms, medications doses, her options of short and long term treatment based on the latest standards of care / guidelines;   and documenting the encounter.  [name]  participated in the discussions, expressed understanding, and voiced agreement with the above plans.  All questions were answered to her satisfaction. she is encouraged to contact clinic should she have any questions or concerns prior to her return visit.    Glade Lloyd, MD [facility] [address] [place], [place] [address] Ph: [phone] F: [phone]  -  This note was partially dictated with voice  recognition software. Similar sounding words can be transcribed inadequately or may not  be corrected upon review.  06/08/2021, 5:43 PM

## 2022-04-05 ENCOUNTER — Telehealth: Payer: Self-pay

## 2022-04-05 ENCOUNTER — Other Ambulatory Visit (HOSPITAL_COMMUNITY): Payer: Self-pay

## 2022-04-05 NOTE — Telephone Encounter (Signed)
Patient Advocate Encounter   Received notification from [employer] Part D that prior authorization is required for Tirosint 75MCG capsules  Submitted: 04/05/22 Key  [id number]   PA pending

## 2022-04-05 NOTE — Telephone Encounter (Signed)
[employer] called stating pt needs a prior authorization for her tirosent. Stated pt has a short supply.

## 2022-04-07 NOTE — Telephone Encounter (Signed)
Pharmacy Patient Advocate Encounter  Prior Authorization for Tirosint 75MCG capsules has been approved by [employer] Part D     PA # [id number] Effective dates: 04/06/22 through 01/10/23

## 2022-04-12 ENCOUNTER — Other Ambulatory Visit: Payer: Self-pay | Admitting: "Endocrinology

## 2022-04-12 DIAGNOSIS — E119 Type 2 diabetes mellitus without complications: Secondary | ICD-10-CM

## 2022-04-12 NOTE — Telephone Encounter (Signed)
Called Jarrett Soho with [employer] to update on pt's PA approval of tirosent.

## 2022-07-04 ENCOUNTER — Other Ambulatory Visit (HOSPITAL_COMMUNITY): Payer: Self-pay | Admitting: Internal Medicine

## 2022-07-04 DIAGNOSIS — Z1231 Encounter for screening mammogram for malignant neoplasm of breast: Secondary | ICD-10-CM

## 2022-07-15 ENCOUNTER — Other Ambulatory Visit: Payer: Self-pay | Admitting: "Endocrinology

## 2022-07-15 DIAGNOSIS — E119 Type 2 diabetes mellitus without complications: Secondary | ICD-10-CM

## 2022-07-18 ENCOUNTER — Other Ambulatory Visit: Payer: Self-pay | Admitting: "Endocrinology

## 2022-07-18 DIAGNOSIS — E039 Hypothyroidism, unspecified: Secondary | ICD-10-CM

## 2022-07-18 DIAGNOSIS — E119 Type 2 diabetes mellitus without complications: Secondary | ICD-10-CM

## 2022-09-05 ENCOUNTER — Ambulatory Visit (HOSPITAL_COMMUNITY)
Admission: RE | Admit: 2022-09-05 | Discharge: 2022-09-05 | Disposition: A | Discharge status: Home / Self Care | Payer: Medicare Other | Source: Ambulatory Visit | Attending: Internal Medicine | Admitting: Internal Medicine

## 2022-09-05 DIAGNOSIS — Z1231 Encounter for screening mammogram for malignant neoplasm of breast: Secondary | ICD-10-CM | POA: Diagnosis present

## 2022-09-14 ENCOUNTER — Ambulatory Visit: Payer: Medicare Other | Admitting: "Endocrinology

## 2022-09-16 IMAGING — DX DG SHOULDER 2+V*R*
3 series · 3 of 3 positions shown · non-contrast
Comparison: None Available.

CLINICAL DATA: Right shoulder pain, unspecified chronicity. Patient
reports right arm pain for a few months.

EXAM:
RIGHT SHOULDER - 2+ VIEW

[shoulder grashey]
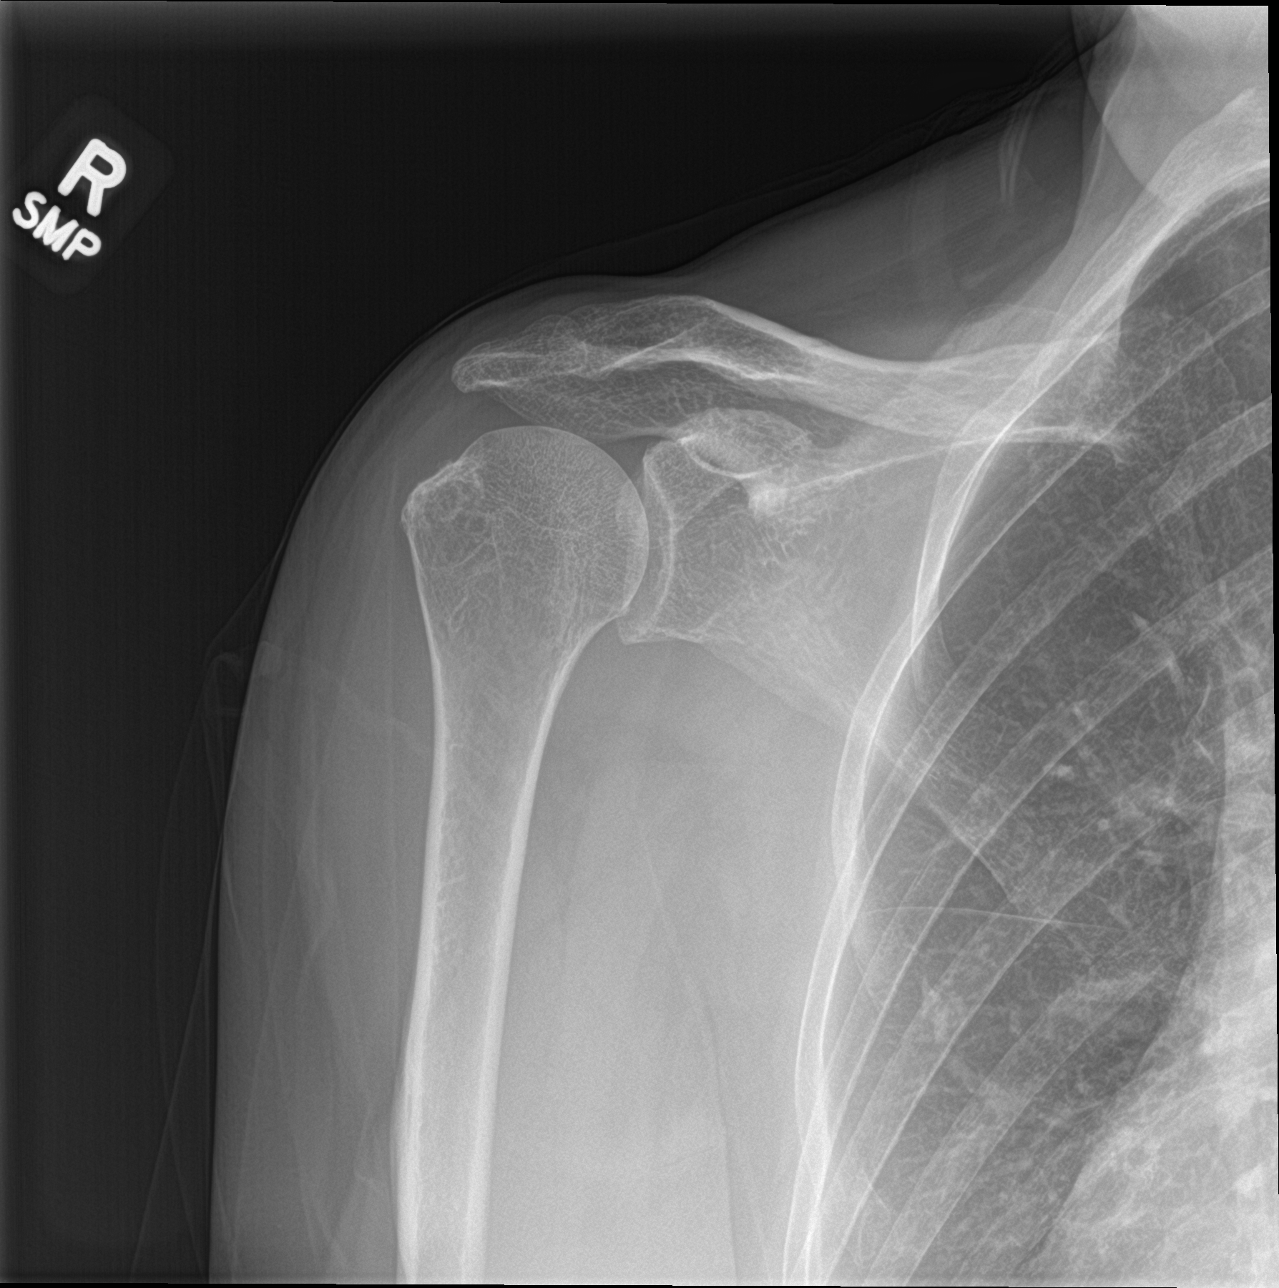

[shoulder y view]
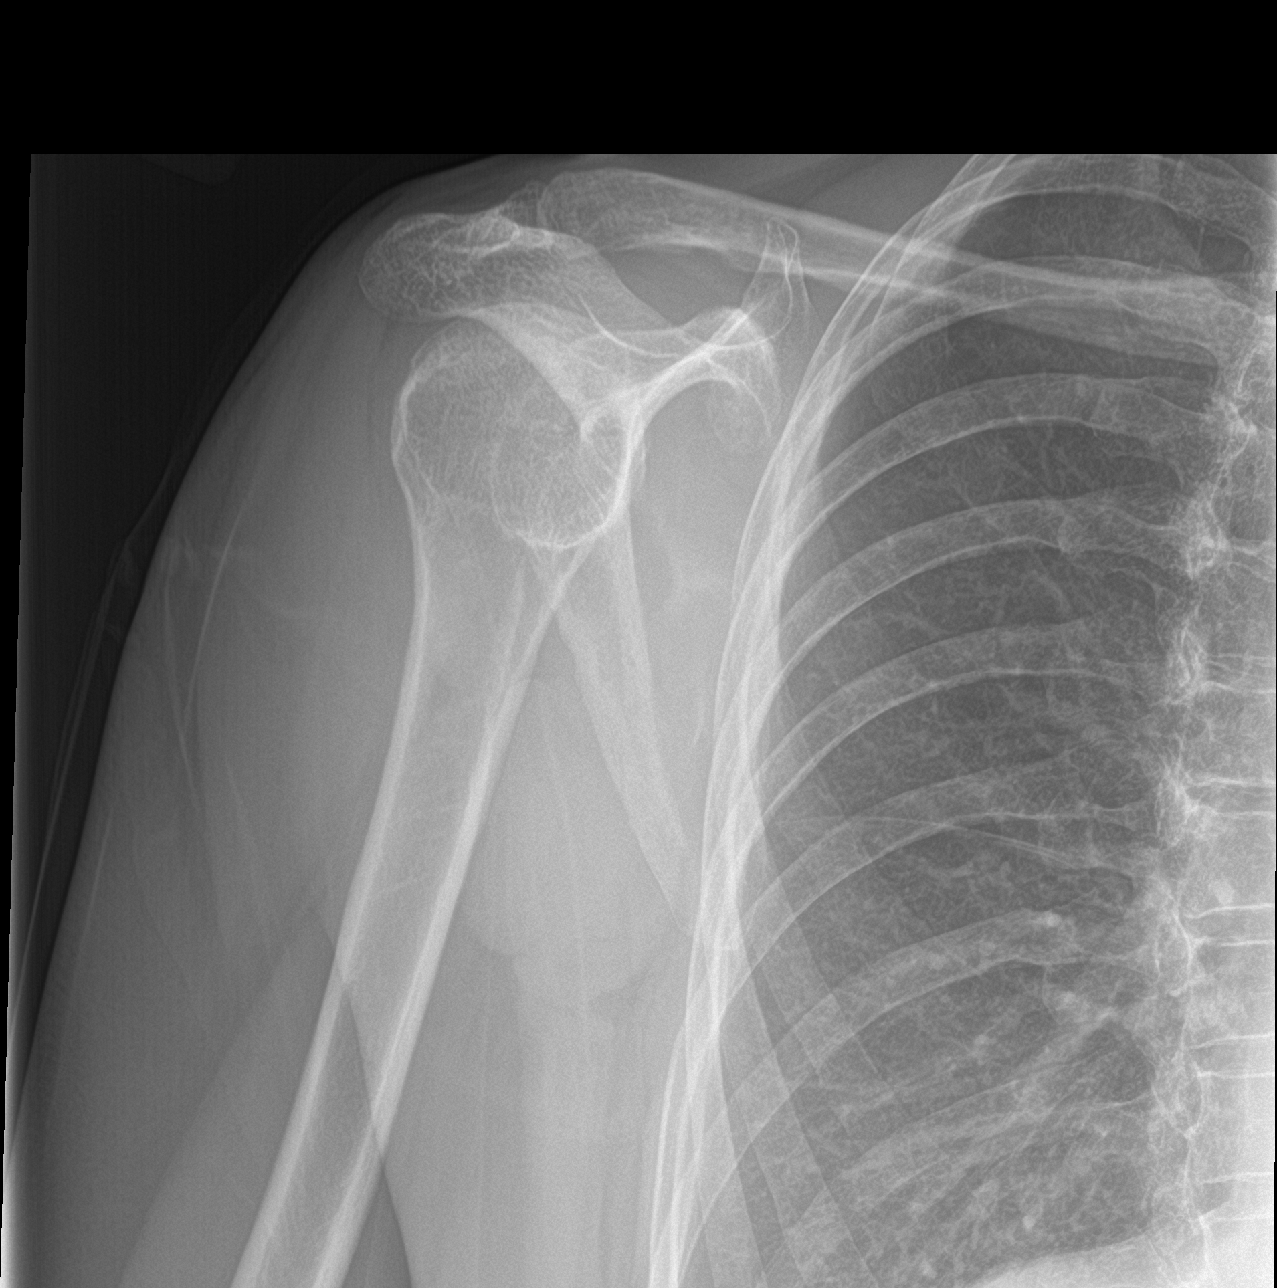

[shoulder axillary]
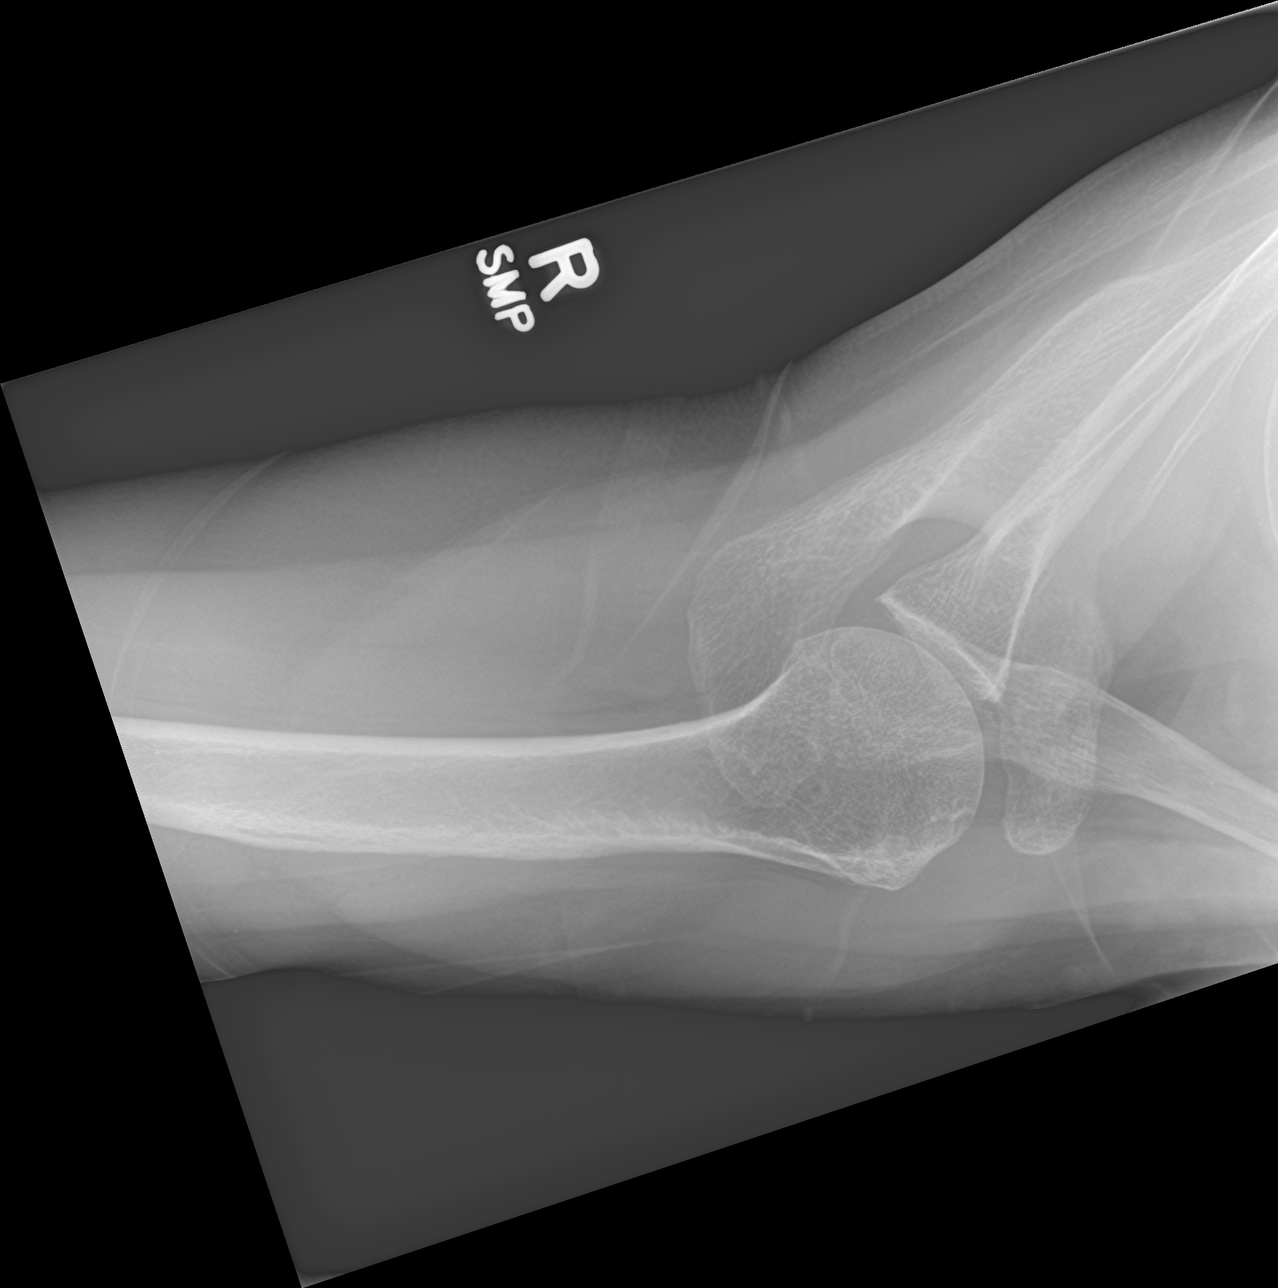

[3 of 3 positions shown; findings below may reference images not displayed]

FINDINGS: The bones are subjectively under mineralized. Normal alignment.
Glenohumeral joint space is preserved with minimal glenoid spurring.
The acromioclavicular joint is congruent with minimal degenerative
change. There is mild subcortical cystic change in the lateral
humeral head. No fracture, erosion, evidence of focal bone lesion or
avascular necrosis. No soft tissue calcifications. No abnormalities
in the included portion of the right hemithorax.
IMPRESSION: 1. Minimal osteoarthritis of the acromioclavicular and glenohumeral
joints.
2. Mild subcortical cystic change in the lateral humeral head, can
be seen with rotator cuff pathology.

## 2022-09-27 ENCOUNTER — Ambulatory Visit: Payer: Medicare Other | Admitting: "Endocrinology

## 2022-11-08 LAB — LIPID PANEL
Cholesterol: 128 (ref 0–200)
HDL: 83 — AB (ref 35–70)
LDL Cholesterol: 37
Triglycerides: 41 (ref 40–160)

## 2022-11-08 LAB — TSH: TSH: 0.57 (ref 0.41–5.90)

## 2022-11-09 ENCOUNTER — Encounter: Payer: Self-pay | Admitting: "Endocrinology

## 2022-11-15 ENCOUNTER — Encounter: Payer: Self-pay | Admitting: "Endocrinology

## 2022-11-15 ENCOUNTER — Ambulatory Visit (INDEPENDENT_AMBULATORY_CARE_PROVIDER_SITE_OTHER): Payer: Medicare Other | Admitting: "Endocrinology

## 2022-11-15 VITALS — BP 136/78 | HR 72 | Ht 65.0 in | Wt 171.0 lb

## 2022-11-15 DIAGNOSIS — E782 Mixed hyperlipidemia: Secondary | ICD-10-CM | POA: Diagnosis not present

## 2022-11-15 DIAGNOSIS — E039 Hypothyroidism, unspecified: Secondary | ICD-10-CM

## 2022-11-15 DIAGNOSIS — Z7984 Long term (current) use of oral hypoglycemic drugs: Secondary | ICD-10-CM | POA: Diagnosis not present

## 2022-11-15 DIAGNOSIS — E119 Type 2 diabetes mellitus without complications: Secondary | ICD-10-CM

## 2022-11-15 LAB — POCT GLYCOSYLATED HEMOGLOBIN (HGB A1C): HbA1c, POC (controlled diabetic range): 7.3 % — AB (ref 0.0–7.0)

## 2022-11-15 MED ORDER — ACCU-CHEK GUIDE VI STRP: ORAL_STRIP | 2 refills | Status: DC

## 2022-11-15 MED ORDER — EMPAGLIFLOZIN 10 MG PO TABS: 10.0000 mg | ORAL_TABLET | Freq: Every day | ORAL | 1 refills | Status: DC

## 2022-11-15 NOTE — Patient Instructions (Signed)
                                     Advice for Weight Management  -For most of us the best way to lose weight is by diet management. Generally speaking, diet management means consuming less calories intentionally which over time brings about progressive weight loss.  This can be achieved more effectively by avoiding ultra processed carbohydrates, processed meats, unhealthy fats.    It is critically important to know your numbers: how much calorie you are consuming and how much calorie you need. More importantly, our carbohydrates sources should be unprocessed naturally occurring  complex starch food items.  It is always important to balance nutrition also by  appropriate intake of proteins (mainly plant-based), healthy fats/oils, plenty of fruits and vegetables.   -The American College of Lifestyle Medicine (ACL M) recommends nutrition derived mostly from Whole Food, Plant Predominant Sources example an apple instead of applesauce or apple pie. Eat Plenty of vegetables, Mushrooms, fruits, Legumes, Whole Grains, Nuts, seeds in lieu of processed meats, processed snacks/pastries red meat, poultry, eggs.  Use only water or unsweetened tea for hydration.  The College also recommends the need to stay away from risky substances including alcohol, smoking; obtaining 7-9 hours of restorative sleep, at least 150 minutes of moderate intensity exercise weekly, importance of healthy social connections, and being mindful of stress and seek help when it is overwhelming.    -Sticking to a routine mealtime to eat 3 meals a day and avoiding unnecessary snacks is shown to have a big role in weight control. Under normal circumstances, the only time we burn stored energy is when we are hungry, so allow  some hunger to take place- hunger means no food between appropriate meal times, only water.  It is not advisable to starve.   -It is better to avoid simple carbohydrates including:  Cakes, Sweet Desserts, Ice Cream, Soda (diet and regular), Sweet Tea, Candies, Chips, Cookies, Store Bought Juices, Alcohol in Excess of  1-2 drinks a day, Lemonade,  Artificial Sweeteners, Doughnuts, Coffee Creamers, "Sugar-free" Products, etc, etc.  This is not a complete list.....    -Consulting with certified diabetes educators is proven to provide you with the most accurate and current information on diet.  Also, you may be  interested in discussing diet options/exchanges , we can schedule a visit with Erin Chen, RDN, CDE for individualized nutrition education.  -Exercise: If you are able: 30 -60 minutes a day ,4 days a week, or 150 minutes of moderate intensity exercise weekly.    The longer the better if tolerated.  Combine stretch, strength, and aerobic activities.  If you were told in the past that you have high risk for cardiovascular diseases, or if you are currently symptomatic, you may seek evaluation by your heart doctor prior to initiating moderate to intense exercise programs.                                  Additional Care Considerations for Diabetes/Prediabetes   -Diabetes  is a chronic disease.  The most important care consideration is regular follow-up with your diabetes care provider with the goal being avoiding or delaying its complications and to take advantage of advances in medications and technology.  If appropriate actions are taken early enough, type 2 diabetes can even be   reversed.  Seek information from the right source.  - Whole Food, Plant Predominant Nutrition is highly recommended: Eat Plenty of vegetables, Mushrooms, fruits, Legumes, Whole Grains, Nuts, seeds in lieu of processed meats, processed snacks/pastries red meat, poultry, eggs as recommended by American College of  Lifestyle Medicine (ACLM).  -Type 2 diabetes is known to coexist with other important comorbidities such as high blood pressure and high cholesterol.  It is critical to control not only the  diabetes but also the high blood pressure and high cholesterol to minimize and delay the risk of complications including coronary artery disease, stroke, amputations, blindness, etc.  The good news is that this diet recommendation for type 2 diabetes is also very helpful for managing high cholesterol and high blood blood pressure.  - Studies showed that people with diabetes will benefit from a class of medications known as ACE inhibitors and statins.  Unless there are specific reasons not to be on these medications, the standard of care is to consider getting one from these groups of medications at an optimal doses.  These medications are generally considered safe and proven to help protect the heart and the kidneys.    - People with diabetes are encouraged to initiate and maintain regular follow-up with eye doctors, foot doctors, dentists , and if necessary heart and kidney doctors.     - It is highly recommended that people with diabetes quit smoking or stay away from smoking, and get yearly  flu vaccine and pneumonia vaccine at least every 5 years.  See above for additional recommendations on exercise, sleep, stress management , and healthy social connections.      

## 2022-11-15 NOTE — Progress Notes (Addendum)
 11/15/2022  Endocrinology follow-up note  HPI   Erin Chen is a 72 y.o.-year-old female, here to follow up for hypothyroidism, type 2 diabetes.   She has hx of hypothyroidism with intolerance to levothyroxine .  She is currently on Tirosint  75 mcg p.o. daily before breakfast.  She continues to tolerate this regimen very well.  Her previsit thyroid  function test are consistent with appropriate replacement.    - She reports better compliance with her medications this time.  She has steady weight, denies palpitations, tremors, nor heat intolerance.      She denies family history of thyroid  dysfunction. She denies personal history of goiter. She denies family history of thyroid  cancer. She denies history of antithyroid therapy nor radiation to the neck.   She has had problems swallowing metformin, was initiated on low-dose glipizide  during her last visit.  Her point-of-care A1c 7.3%, progressively increasing from 6.3%.   Allergies as of 11/15/2022       Reactions   Levothyroxine     Synthroid  [levothyroxine  Sodium]         Medication List        Accurate as of November 15, 2022  9:36 AM. If you have any questions, ask your nurse or doctor.          STOP taking these medications    glipiZIDE  5 MG 24 hr tablet Commonly known as: GLUCOTROL  XL Stopped by: Ranny Earl       TAKE these medications    Accu-Chek Guide Me w/Device Kit 1 Piece by Does not apply route as directed.   Accu-Chek Guide test strip Generic drug: glucose blood CHECK GLUCOSE TWICE DAILY   Accu-Chek Softclix Lancets lancets Test BG bid. Dx E11.65   amLODipine 2.5 MG tablet Commonly known as: NORVASC Take 2.5 mg by mouth daily.   atorvastatin 20 MG tablet Commonly known as: LIPITOR Take 1 tablet by mouth daily.   CALCIUM 500 PO Take 1 tablet by mouth daily in the afternoon.   cyanocobalamin 1000 MCG tablet Commonly known as: VITAMIN B12 Take 1,000 mcg by mouth daily.    empagliflozin  10 MG Tabs tablet Commonly known as: Jardiance  Take 1 tablet (10 mg total) by mouth daily before breakfast. Started by: Gebre Joei Frangos   Flonase Allergy Relief 50 MCG/ACT nasal spray Generic drug: fluticasone 1 spray in each nostril Nasally twice a day for 30 days   lansoprazole 30 MG capsule Commonly known as: PREVACID Take 30 mg by mouth every morning.   montelukast 10 MG tablet Commonly known as: SINGULAIR Take 10 mg by mouth daily.   Tirosint  75 MCG Caps Generic drug: Levothyroxine  Sodium TAKE 1 CAPSULE BY MOUTH ONCE DAILY BEFORE BREAKFAST   Vitamin D3 50 MCG (2000 UT) Tabs Take by mouth daily.         PE: BP 136/78   Pulse 72   Ht 5' 5 (1.651 m)   Wt 171 lb (77.6 kg)   LMP 05/22/2017   BMI 28.46 kg/m  Wt Readings from Last 3 Encounters:  11/15/22 171 lb (77.6 kg)  03/14/22 168 lb 3.2 oz (76.3 kg)  12/08/21 167 lb 6.4 oz (75.9 kg)     April 06, 2017 labs showed: Free T4 within normal limits at 1.4 2, TSH within normal limits 0.4 - RECENT LABS SHOWING A1C OF 7.1% INCREASING FROM 6.6%.   Recent Results (from the past 2160 hour(s))  Lipid panel     Status: Abnormal   Collection Time: 11/08/22 12:00 AM  Result Value Ref Range   Triglycerides 41 40 - 160   Cholesterol 128 0 - 200   HDL 83 (A) 35 - 70   LDL Cholesterol 37   TSH     Status: None   Collection Time: 11/08/22 12:00 AM  Result Value Ref Range   TSH 0.57 0.41 - 5.90    Comment: Free T4 1.34  HgB A1c     Status: Abnormal   Collection Time: 11/15/22  9:19 AM  Result Value Ref Range   Hemoglobin A1C     HbA1c POC (<> result, manual entry)     HbA1c, POC (prediabetic range)     HbA1c, POC (controlled diabetic range) 7.3 (A) 0.0 - 7.0 %    ASSESSMENT: 1. 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 2. Type 2 diabetes with A1c  7.3% 3.  Hyperlipidemia-controlled  PLAN:  -  Her thyroid  function tests are consistent with appropriate replacement.  She is advised to continue  Tirosint  75 mcg p.o. daily before breakfast.     - We discussed about the correct intake of her thyroid  hormone, on empty stomach at fasting, with water, separated by at least 30 minutes from breakfast and other medications,  and separated by more than 4 hours from calcium, iron, multivitamins, acid reflux medications (PPIs). -Patient is made aware of the fact that thyroid  hormone replacement is needed for life, dose to be adjusted by periodic monitoring of thyroid  function tests.   Regarding her controlled type 2 DM:  --She presents with higher A1c of 7.3%.  She would benefit from an SGLT2 inhibitor instead of glipizide .  I discussed and prescribed Jardiance  10 mg p.o. daily at breakfast.  She is advised to discontinue glipizide  after she secures the prescription for Jardiance .   If her insurance does not pay for Jardiance , she will be resumed on glipizide  5 mg XL p.o. daily at breakfast.   - she acknowledges that there is a room for improvement in her food and drink choices. - Suggestion is made for her to avoid simple carbohydrates  from her diet including Cakes, Sweet Desserts, Ice Cream, Soda (diet and regular), Sweet Tea, Candies, Chips, Cookies, Store Bought Juices, Alcohol in Excess of  1-2 drinks a day, Artificial Sweeteners,  Coffee Creamer, and Sugar-free Products, Lemonade. This will help patient to have more stable blood glucose profile and potentially avoid unintended weight gain.  Her blood pressure is controlled at 136/78,   currently on amlodipine 5 mg p.o. daily.   Her previsit lipid panel showed controlled LDL at 37.  She is responding and advised to continue atorvastatin 20 mg p.o. daily at bedtime.  Side effects and precautions discussed with her.  Her screening ABI was negative for PAD in April 2022.  She is advised to maintain close follow-up with her PMD.   I spent  25  minutes in the care of the patient today including review of labs from Thyroid  Function, CMP, and  other relevant labs ; imaging/biopsy records (current and previous including abstractions from other facilities); face-to-face time discussing  her lab results and symptoms, medications doses, her options of short and long term treatment based on the latest standards of care / guidelines;   and documenting the encounter.  Medora VEAR Molt  participated in the discussions, expressed understanding, and voiced agreement with the above plans.  All questions were answered to her satisfaction. she is encouraged to contact clinic should she have any questions or concerns prior to her return visit.  Ranny Earl, MD [facility] [address] [place], [place] [address] Ph: [phone] F: [phone]  -  This note was partially dictated with voice recognition software. Similar sounding words can be transcribed inadequately or may not  be corrected upon review.  11/15/2022, 9:36 AM

## 2022-11-22 ENCOUNTER — Other Ambulatory Visit: Payer: Self-pay | Admitting: "Endocrinology

## 2022-11-22 DIAGNOSIS — E039 Hypothyroidism, unspecified: Secondary | ICD-10-CM

## 2022-12-30 ENCOUNTER — Other Ambulatory Visit: Payer: Self-pay | Admitting: "Endocrinology

## 2022-12-30 DIAGNOSIS — E039 Hypothyroidism, unspecified: Secondary | ICD-10-CM

## 2023-01-02 ENCOUNTER — Ambulatory Visit (INDEPENDENT_AMBULATORY_CARE_PROVIDER_SITE_OTHER): Payer: Medicare Other | Admitting: Adult Health

## 2023-01-02 VITALS — BP 126/63 | HR 78 | Ht 65.0 in | Wt 169.0 lb

## 2023-01-02 DIAGNOSIS — G4733 Obstructive sleep apnea (adult) (pediatric): Secondary | ICD-10-CM

## 2023-01-02 NOTE — Progress Notes (Signed)
@Patient  ID: Erin Chen, female    DOB: 19-Jun-1950, 72 y.o.   MRN: [id number]  Chief Complaint  Patient presents with   Sleep Apnea    On CPAP     Referring provider: Craig Staggers, MD  HPI: 72 year old female followed for obstructive sleep apnea.  Seen for sleep consult March 2022  TEST/EVENTS :   01/02/2023 Follow up: OSA  Returns for 1 year follow-up.  Patient is followed for obstructive sleep apnea.  Patient says she is on CPAP at bedtime.  Tries to wear her CPAP each night sometimes falls asleep before she puts it on.  CPAP download is unavailable.  Patient also says that her insurance was requiring her change from [facility] to [facility].  We have contacted her DME company for a CPAP download and also previous sleep study results.  Patient says she does feel tired and has some morning headaches.  We talked about CPAP compliance.   Allergies  Allergen Reactions   Levothyroxine    Pneumococcal Polysaccharide Vaccine     Other Reaction(s): arm swollen red and warm   Synthroid [Levothyroxine Sodium]      There is no immunization history on file for this patient.  Past Medical History:  Diagnosis Date   Anemia    COVID-19 01/2019   Diabetes mellitus, type 2 (HCC)    GERD (gastroesophageal reflux disease)    Glaucoma    Hyperlipidemia    Hypothyroidism    Sleep apnea     Tobacco History: Social History   Tobacco Use  Smoking Status Never  Smokeless Tobacco Never   Counseling given: Not Answered   Outpatient Medications Prior to Visit  Medication Sig Dispense Refill   Accu-Chek Softclix Lancets lancets Test BG bid. Dx E11.65 100 each 2   amLODipine (NORVASC) 2.5 MG tablet Take 2.5 mg by mouth daily.     atorvastatin (LIPITOR) 20 MG tablet Take 1 tablet by mouth daily.     Blood Glucose Monitoring Suppl (ACCU-CHEK GUIDE ME) w/Device KIT 1 Piece by Does not apply route as directed. 1 kit 0   Calcium Carbonate (CALCIUM 500 PO) Take 1 tablet  by mouth daily in the afternoon.     Cholecalciferol (VITAMIN D3) 2000 UNITS TABS Take by mouth daily.     empagliflozin (JARDIANCE) 10 MG TABS tablet Take 1 tablet (10 mg total) by mouth daily before breakfast. 90 tablet 1   FLONASE ALLERGY RELIEF 50 MCG/ACT nasal spray 1 spray in each nostril Nasally twice a day for 30 days     glucose blood (ACCU-CHEK GUIDE) test strip CHECK GLUCOSE TWICE DAILY 200 strip 2   lansoprazole (PREVACID) 30 MG capsule Take 30 mg by mouth every morning.     latanoprost (XALATAN) 0.005 % ophthalmic solution 1 drop.     losartan (COZAAR) 50 MG tablet Take 50 mg by mouth daily.     montelukast (SINGULAIR) 10 MG tablet Take 10 mg by mouth daily.     TIROSINT 75 MCG CAPS TAKE 1 CAPSULE BY MOUTH ONCE DAILY BEFORE BREAKFAST 30 capsule 0   vitamin B-12 (CYANOCOBALAMIN) 1000 MCG tablet Take 1,000 mcg by mouth daily.     No facility-administered medications prior to visit.     Review of Systems:   Constitutional:   No  weight loss, night sweats,  Fevers, chills, +fatigue, or  lassitude.  HEENT:   No headaches,  Difficulty swallowing,  Tooth/dental problems, or  Sore throat,  No sneezing, itching, ear ache, nasal congestion, post nasal drip,   CV:  No chest pain,  Orthopnea, PND, swelling in lower extremities, anasarca, dizziness, palpitations, syncope.   GI  No heartburn, indigestion, abdominal pain, nausea, vomiting, diarrhea, change in bowel habits, loss of appetite, bloody stools.   Resp: No shortness of breath with exertion or at rest.  No excess mucus, no productive cough,  No non-productive cough,  No coughing up of blood.  No change in color of mucus.  No wheezing.  No chest wall deformity  Skin: no rash or lesions.  GU: no dysuria, change in color of urine, no urgency or frequency.  No flank pain, no hematuria   MS:  No joint pain or swelling.  No decreased range of motion.  No back pain.    Physical Exam  BP 126/63   Pulse 78   Ht 5'  5" (1.651 m)   Wt 169 lb (76.7 kg)   LMP 05/22/2017   SpO2 95%   BMI 28.12 kg/m   GEN: A/Ox3; pleasant , NAD, well nourished    HEENT:  [place]/AT,  EACs-clear, TMs-wnl, NOSE-clear, THROAT-clear, no lesions, no postnasal drip or exudate noted.  Class 3 MP airway   NECK:  Supple w/ fair ROM; no JVD; normal carotid impulses w/o bruits; no thyromegaly or nodules palpated; no lymphadenopathy.    RESP  Clear  P & A; w/o, wheezes/ rales/ or rhonchi. no accessory muscle use, no dullness to percussion  CARD:  RRR, no m/r/g, no peripheral edema, pulses intact, no cyanosis or clubbing.  GI:   Soft & nt; nml bowel sounds; no organomegaly or masses detected.   Musco: Warm bil, no deformities or joint swelling noted.   Neuro: alert, no focal deficits noted.    Skin: Warm, no lesions or rashes    Lab Results:  CBC  BNP No results found for: "BNP"  ProBNP No results found for: "PROBNP"  Imaging: No results found.  Administration History     None           No data to display          No results found for: "NITRICOXIDE"      Assessment & Plan:   OSA (obstructive sleep apnea) History of sleep apnea on nocturnal CPAP.  CPAP download has been requested.  Copy of previous sleep study has been requested. Will adjust CPAP if needed once we have a copy of her download.  Patient is encouraged on daily usage of CPAP  Plan  Patient Instructions  Copy of Sleep study requested.  CPAP download.  Continue on CPAP At bedtime, wear all night long for at least 6hr or more  Work on healthy sleep regimen  Do not drive if sleepy  Follow up in 1 year and As needed         Rubye Oaks, NP 01/02/2023

## 2023-01-02 NOTE — Patient Instructions (Addendum)
Copy of Sleep study requested.  CPAP download.  Continue on CPAP At bedtime, wear all night long for at least 6hr or more  Work on healthy sleep regimen  Do not drive if sleepy  Follow up in 1 year and As needed

## 2023-01-02 NOTE — Assessment & Plan Note (Signed)
History of sleep apnea on nocturnal CPAP.  CPAP download has been requested.  Copy of previous sleep study has been requested. Will adjust CPAP if needed once we have a copy of her download.  Patient is encouraged on daily usage of CPAP  Plan  Patient Instructions  Copy of Sleep study requested.  CPAP download.  Continue on CPAP At bedtime, wear all night long for at least 6hr or more  Work on healthy sleep regimen  Do not drive if sleepy  Follow up in 1 year and As needed

## 2023-01-13 ENCOUNTER — Other Ambulatory Visit: Payer: Self-pay | Admitting: "Endocrinology

## 2023-01-13 DIAGNOSIS — E119 Type 2 diabetes mellitus without complications: Secondary | ICD-10-CM

## 2023-01-31 ENCOUNTER — Telehealth: Payer: Self-pay | Admitting: Adult Health

## 2023-01-31 NOTE — Telephone Encounter (Signed)
Received c-pap compliance report for patient. Place in [place] folder in [place] for her to review

## 2023-01-31 NOTE — Telephone Encounter (Signed)
Patient checking to see if we got records from [facility] [place]. Patient phone number is [phone].

## 2023-01-31 NOTE — Telephone Encounter (Signed)
Spoke pt she said that she took her SD card to [facility] to have it download for C-pap compliance report . She was told that it would be faxed to our office,advised patient that we haven't receive the report . I told patient that I will call them to have them to resend report or add her into airview for further report. Left a detailed vm for the RT to fax report or upload patient into airview along with fax # and phone # to the [place] office. Called pt back to advise pt of this.

## 2023-02-02 NOTE — Telephone Encounter (Signed)
Called and spoke with patient informed pt of compliance report results  Advised pt of Tammy NP recommendation for pt to increase her daily usage of CPAP each night for at least 6 hour or more. Pt stated that she would try to 4 hours  she wasn't sure about doing 6 but she would try. Pt stated that she understood her results. I told her that would note this and would inform the provider.

## 2023-02-02 NOTE — Telephone Encounter (Signed)
CPAP download shows poor compliance with 36% usage.  Daily average usage around 3.5 hours.  AHI 5.5/hour  Recommend that patient increase her daily usage.  Wear CPAP each night for at least 6 hours or more.

## 2023-02-04 LAB — LAB REPORT - SCANNED
A1c: 8
Albumin, Urine POC: 0.7
Creatinine, POC: 58 mg/dL
EGFR: 53
Microalb Creat Ratio: 12
TSH: 3.42 (ref 0.41–5.90)

## 2023-02-14 ENCOUNTER — Other Ambulatory Visit: Payer: Self-pay | Admitting: "Endocrinology

## 2023-02-14 DIAGNOSIS — E039 Hypothyroidism, unspecified: Secondary | ICD-10-CM

## 2023-04-10 ENCOUNTER — Other Ambulatory Visit: Payer: Self-pay | Admitting: "Endocrinology

## 2023-04-14 ENCOUNTER — Other Ambulatory Visit: Payer: Self-pay | Admitting: "Endocrinology

## 2023-04-14 DIAGNOSIS — E039 Hypothyroidism, unspecified: Secondary | ICD-10-CM

## 2023-05-17 ENCOUNTER — Other Ambulatory Visit: Payer: Self-pay | Admitting: "Endocrinology

## 2023-05-17 ENCOUNTER — Ambulatory Visit: Payer: Medicare Other | Admitting: "Endocrinology

## 2023-06-16 LAB — BASIC METABOLIC PANEL WITH GFR
BUN: 20 (ref 4–21)
CO2: 28 — AB (ref 13–22)
Chloride: 102 (ref 99–108)
Creatinine: 0.9 (ref 0.5–1.1)
Glucose: 98
Potassium: 4.3 meq/L (ref 3.5–5.1)
Sodium: 139 (ref 137–147)

## 2023-06-16 LAB — VITAMIN D 25 HYDROXY (VIT D DEFICIENCY, FRACTURES): Vit D, 25-Hydroxy: 40

## 2023-06-16 LAB — LIPID PANEL
Cholesterol: 145 (ref 0–200)
HDL: 76 — AB (ref 35–70)
LDL Cholesterol: 55
Triglycerides: 49 (ref 40–160)

## 2023-06-16 LAB — HEPATIC FUNCTION PANEL
ALT: 26 U/L (ref 7–35)
AST: 27 (ref 13–35)
Alkaline Phosphatase: 64 (ref 25–125)
Bilirubin, Total: 1

## 2023-06-16 LAB — LAB REPORT - SCANNED
Albumin, Urine POC: 0.7
Albumin/Creatinine Ratio, Urine, POC: 12
Creatinine, POC: 58 mg/dL
EGFR (African American): 64

## 2023-06-16 LAB — HEMOGLOBIN A1C: Hemoglobin A1C: 8.1

## 2023-06-16 LAB — COMPREHENSIVE METABOLIC PANEL WITH GFR
Albumin: 4.3 (ref 3.5–5.0)
Calcium: 9.9 (ref 8.7–10.7)

## 2023-06-16 LAB — TSH: TSH: 0.91 (ref 0.41–5.90)

## 2023-06-17 LAB — LAB REPORT - SCANNED
A1c: 8.1
TSH W/REFLEX TO FT4: 0.91

## 2023-06-22 ENCOUNTER — Ambulatory Visit: Admitting: "Endocrinology

## 2023-07-03 ENCOUNTER — Other Ambulatory Visit: Payer: Self-pay | Admitting: "Endocrinology

## 2023-07-03 DIAGNOSIS — E039 Hypothyroidism, unspecified: Secondary | ICD-10-CM

## 2023-08-01 ENCOUNTER — Ambulatory Visit (INDEPENDENT_AMBULATORY_CARE_PROVIDER_SITE_OTHER): Admitting: "Endocrinology

## 2023-08-01 ENCOUNTER — Encounter: Payer: Self-pay | Admitting: "Endocrinology

## 2023-08-01 VITALS — BP 120/74 | HR 76 | Ht 65.0 in | Wt 160.2 lb

## 2023-08-01 DIAGNOSIS — Z7984 Long term (current) use of oral hypoglycemic drugs: Secondary | ICD-10-CM

## 2023-08-01 DIAGNOSIS — E119 Type 2 diabetes mellitus without complications: Secondary | ICD-10-CM

## 2023-08-01 DIAGNOSIS — E039 Hypothyroidism, unspecified: Secondary | ICD-10-CM

## 2023-08-01 DIAGNOSIS — E1165 Type 2 diabetes mellitus with hyperglycemia: Secondary | ICD-10-CM

## 2023-08-01 DIAGNOSIS — E782 Mixed hyperlipidemia: Secondary | ICD-10-CM | POA: Diagnosis not present

## 2023-08-01 MED ORDER — EMPAGLIFLOZIN 25 MG PO TABS: 25.0000 mg | ORAL_TABLET | Freq: Every day | ORAL | 1 refills | Status: DC

## 2023-08-01 NOTE — Progress Notes (Signed)
 08/01/2023  Endocrinology follow-up note  HPI   Erin Chen is a 73 y.o.-year-old female, here to follow up for hypothyroidism, type 2 diabetes.  She has not been to clinic since November 2024. She has hx of hypothyroidism with intolerance to levothyroxine .  She is currently on Tirosint  75 mcg p.o. daily before breakfast.  This was necessary due to his prior intolerance to the regular formulations of levothyroxine  including Synthroid .  She continues to tolerate Tirosint .  Her previsit thyroid  function tests are consistent with appropriate replacement.  She has no new complaints.  She denies family history of thyroid  dysfunction. She denies personal history of goiter. She denies family history of thyroid  cancer. She denies history of antithyroid therapy nor radiation to the neck.   For her type 2 diabetes, she was put on Jardiance  10 mg p.o. daily at breakfast due to her intolerance to metformin.  She presents with a higher A1c of 8.1%, progressively increasing from 6.3%.    She has not made any significant change in her lifestyle.     Allergies as of 08/01/2023       Reactions   Levothyroxine     Pneumococcal Polysaccharide Vaccine    Other Reaction(s): arm swollen red and warm   Synthroid  [levothyroxine  Sodium]         Medication List        Accurate as of August 01, 2023 11:12 AM. If you have any questions, ask your nurse or doctor.          Accu-Chek Guide Me w/Device Kit 1 Piece by Does not apply route as directed.   Accu-Chek Guide test strip Generic drug: glucose blood CHECK GLUCOSE TWICE DAILY   Accu-Chek Softclix Lancets lancets USE 1  TO CHECK GLUCOSE TWICE DAILY   amLODipine 2.5 MG tablet Commonly known as: NORVASC Take 2.5 mg by mouth daily.   atorvastatin 20 MG tablet Commonly known as: LIPITOR Take 1 tablet by mouth daily.   CALCIUM 500 PO Take 1 tablet by mouth daily in the afternoon.   cyanocobalamin 1000 MCG tablet Commonly known as:  VITAMIN B12 Take 1,000 mcg by mouth daily.   empagliflozin  25 MG Tabs tablet Commonly known as: Jardiance  Take 1 tablet (25 mg total) by mouth daily before breakfast. What changed:  medication strength how much to take Changed by: Gebre Shanan Fitzpatrick, MD   Flonase Allergy Relief 50 MCG/ACT nasal spray Generic drug: fluticasone 1 spray in each nostril Nasally twice a day for 30 days   lansoprazole 30 MG capsule Commonly known as: PREVACID Take 30 mg by mouth every morning.   latanoprost 0.005 % ophthalmic solution Commonly known as: XALATAN 1 drop.   losartan 50 MG tablet Commonly known as: COZAAR Take 50 mg by mouth daily.   montelukast 10 MG tablet Commonly known as: SINGULAIR Take 10 mg by mouth daily.   Tirosint  75 MCG Caps Generic drug: Levothyroxine  Sodium Take 1 capsule (75 mcg total) by mouth daily before breakfast. TAKE 1 CAPSULE BY MOUTH ONCE DAILY BEFORE BREAKFAST   Vitamin D3 50 MCG (2000 UT) Tabs Take by mouth daily.         PE: BP 120/74   Pulse 76   Ht 5' 5 (1.651 m)   Wt 160 lb 3.2 oz (72.7 kg)   LMP 05/22/2017   BMI 26.66 kg/m  Wt Readings from Last 3 Encounters:  08/01/23 160 lb 3.2 oz (72.7 kg)  01/02/23 169 lb (76.7 kg)  11/15/22 171 lb (77.6  kg)     April 06, 2017 labs showed: Free T4 within normal limits at 1.4 2, TSH within normal limits 0.4 - RECENT LABS SHOWING A1C OF 7.1% INCREASING FROM 6.6%.   Recent Results (from the past 2160 hours)  VITAMIN D  25 Hydroxy (Vit-D Deficiency, Fractures)     Status: None   Collection Time: 06/16/23 12:00 AM  Result Value Ref Range   Vit D, 25-Hydroxy 40   Basic metabolic panel with GFR     Status: Abnormal   Collection Time: 06/16/23 12:00 AM  Result Value Ref Range   Glucose 98    BUN 20 4 - 21   CO2 28 (A) 13 - 22   Creatinine 0.9 0.5 - 1.1   Potassium 4.3 3.5 - 5.1 mEq/L   Sodium 139 137 - 147   Chloride 102 99 - 108  Comprehensive metabolic panel with GFR     Status: None   Collection  Time: 06/16/23 12:00 AM  Result Value Ref Range   Calcium 9.9 8.7 - 10.7   Albumin 4.3 3.5 - 5.0  Lipid panel     Status: Abnormal   Collection Time: 06/16/23 12:00 AM  Result Value Ref Range   Triglycerides 49 40 - 160   Cholesterol 145 0 - 200   HDL 76 (A) 35 - 70   LDL Cholesterol 55   Hepatic function panel     Status: None   Collection Time: 06/16/23 12:00 AM  Result Value Ref Range   Alkaline Phosphatase 64 25 - 125   ALT 26 7 - 35 U/L   AST 27 13 - 35   Bilirubin, Total 1.0   Hemoglobin A1c     Status: None   Collection Time: 06/16/23 12:00 AM  Result Value Ref Range   Hemoglobin A1C 8.1   TSH     Status: None   Collection Time: 06/16/23 12:00 AM  Result Value Ref Range   TSH 0.91 0.41 - 5.90     ASSESSMENT: 1. 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 2. Type 2 diabetes with A1c increasing to  8.1%  from 6.3% 3.  Hyperlipidemia-controlled  PLAN:  -  Her thyroid  function tests are consistent with appropriate replacement.  She is advised to continue Tirosint  75 mcg p.o. daily before breakfast .   - We discussed about the correct intake of her thyroid  hormone, on empty stomach at fasting, with water, separated by at least 30 minutes from breakfast and other medications,  and separated by more than 4 hours from calcium, iron, multivitamins, acid reflux medications (PPIs). -Patient is made aware of the fact that thyroid  hormone replacement is needed for life, dose to be adjusted by periodic monitoring of thyroid  function tests.   Regarding her controlled type 2 DM:  --She presents with a higher A1c of 8.1%.  She had benefited from SGLT2 inhibitor, however still increased her Jardiance  to 25 mg p.o. daily at breakfast.  She did not tolerate metformin.  If she cannot control by next visit, low-dose glipizide  will be added back.   She is encouraged to continue monitoring blood glucose twice a day-daily before breakfast and at bedtime and call clinic for hypoglycemia  under 70 and hyperglycemia above  200 milligrams per DL.   - she acknowledges that there is a room for improvement in her food and drink choices. - Suggestion is made for her to avoid simple carbohydrates  from her diet including Cakes, Sweet Desserts, Ice Cream, Soda (diet and regular),  Sweet Tea, Candies, Chips, Cookies, Store Bought Juices, Alcohol in Excess of  1-2 drinks a day, Artificial Sweeteners,  Coffee Creamer, and Sugar-free Products, Lemonade. This will help patient to have more stable blood glucose profile and potentially avoid unintended weight gain.  Her blood pressure is controlled at 120/74 mmHg.  She is no longer on amlodipine, advised to continue losartan 50 mg p.o. daily.  Her previsit lipid panel showed controlled LDL at 55.  She is responding and tolerating atorvastatin.  She is advised to continue her atorvastatin 20 mg p.o. nightly.    Side effects and precautions discussed with her.  Her screening ABI was negative for PAD in April 2022.  She is advised to maintain close follow-up with her PMD.  I spent  25  minutes in the care of the patient today including review of labs from Thyroid  Function, CMP, and other relevant labs ; imaging/biopsy records (current and previous including abstractions from other facilities); face-to-face time discussing  her lab results and symptoms, medications doses, her options of short and long term treatment based on the latest standards of care / guidelines;   and documenting the encounter.  Erin Chen  participated in the discussions, expressed understanding, and voiced agreement with the above plans.  All questions were answered to her satisfaction. she is encouraged to contact clinic should she have any questions or concerns prior to her return visit.   Ranny Earl, MD [facility] [address] [place], [place] [address] Ph: [phone] F: [phone]  -  This note was  partially dictated with voice recognition software. Similar sounding words can be transcribed inadequately or may not  be corrected upon review.  08/01/2023, 11:12 AM

## 2023-08-09 ENCOUNTER — Other Ambulatory Visit (HOSPITAL_COMMUNITY): Payer: Self-pay | Admitting: Internal Medicine

## 2023-08-09 DIAGNOSIS — Z1231 Encounter for screening mammogram for malignant neoplasm of breast: Secondary | ICD-10-CM

## 2023-09-08 ENCOUNTER — Ambulatory Visit (HOSPITAL_COMMUNITY)
Admission: RE | Admit: 2023-09-08 | Discharge: 2023-09-08 | Disposition: A | Discharge status: Home / Self Care | Source: Ambulatory Visit | Attending: Internal Medicine | Admitting: Internal Medicine

## 2023-09-08 DIAGNOSIS — Z1231 Encounter for screening mammogram for malignant neoplasm of breast: Secondary | ICD-10-CM | POA: Diagnosis present

## 2023-10-26 LAB — LAB REPORT - SCANNED
EGFR: 72
Free T4: 1.28
TSH: 1.74 (ref 0.41–5.90)
TSH: 1.74 (ref 0.41–5.90)

## 2023-11-03 ENCOUNTER — Ambulatory Visit: Admitting: "Endocrinology

## 2023-11-03 ENCOUNTER — Encounter: Payer: Self-pay | Admitting: "Endocrinology

## 2023-11-03 VITALS — BP 122/70 | HR 80 | Ht 65.0 in | Wt 157.6 lb

## 2023-11-03 DIAGNOSIS — I10 Essential (primary) hypertension: Secondary | ICD-10-CM

## 2023-11-03 DIAGNOSIS — Z7984 Long term (current) use of oral hypoglycemic drugs: Secondary | ICD-10-CM | POA: Diagnosis not present

## 2023-11-03 DIAGNOSIS — E782 Mixed hyperlipidemia: Secondary | ICD-10-CM

## 2023-11-03 DIAGNOSIS — E039 Hypothyroidism, unspecified: Secondary | ICD-10-CM

## 2023-11-03 DIAGNOSIS — E119 Type 2 diabetes mellitus without complications: Secondary | ICD-10-CM | POA: Diagnosis not present

## 2023-11-03 LAB — POCT GLYCOSYLATED HEMOGLOBIN (HGB A1C): HbA1c, POC (controlled diabetic range): 7.5 % — AB (ref 0.0–7.0)

## 2023-11-03 NOTE — Progress Notes (Signed)
 11/03/2023  Endocrinology follow-up note  HPI   Erin Chen is a 73 y.o.-year-old female, here to follow up for hypothyroidism, type 2 diabetes.   She has hx of hypothyroidism with intolerance to levothyroxine , and Synthroid .  She is currently on Tirosint  75 mcg p.o. daily before breakfast.  She continues to tolerate Tirosint .  Her previsit thyroid  function test are consistent with appropriate replacement.    She has no new complaints.  She denies family history of thyroid  dysfunction. She denies personal history of goiter. She denies family history of thyroid  cancer. She denies history of antithyroid therapy nor radiation to the neck.   For her type 2 diabetes, she was put on Jardiance  10 mg p.o. daily at breakfast due to her intolerance to metformin.  Her point-of-care A1c is 7.5% improved from 8.1%.  She has not made any significant change in her lifestyle.     Allergies as of 11/03/2023       Reactions   Levothyroxine     Pneumococcal Polysaccharide Vaccine    Other Reaction(s): arm swollen red and warm   Synthroid  [levothyroxine  Sodium]         Medication List        Accurate as of November 03, 2023 11:46 AM. If you have any questions, ask your nurse or doctor.          Accu-Chek Guide Me w/Device Kit 1 Piece by Does not apply route as directed.   Accu-Chek Guide test strip Generic drug: glucose blood CHECK GLUCOSE TWICE DAILY   Accu-Chek Softclix Lancets lancets USE 1  TO CHECK GLUCOSE TWICE DAILY   amLODipine 2.5 MG tablet Commonly known as: NORVASC Take 2.5 mg by mouth daily.   atorvastatin 20 MG tablet Commonly known as: LIPITOR Take 1 tablet by mouth daily.   CALCIUM 500 PO Take 1 tablet by mouth daily in the afternoon.   cyanocobalamin 1000 MCG tablet Commonly known as: VITAMIN B12 Take 1,000 mcg by mouth daily.   empagliflozin  25 MG Tabs tablet Commonly known as: Jardiance  Take 1 tablet (25 mg total) by mouth daily before  breakfast.   Flonase Allergy Relief 50 MCG/ACT nasal spray Generic drug: fluticasone 1 spray in each nostril Nasally twice a day for 30 days   lansoprazole 30 MG capsule Commonly known as: PREVACID Take 30 mg by mouth every morning.   latanoprost 0.005 % ophthalmic solution Commonly known as: XALATAN 1 drop.   losartan 50 MG tablet Commonly known as: COZAAR Take 50 mg by mouth daily.   montelukast 10 MG tablet Commonly known as: SINGULAIR Take 10 mg by mouth daily.   Tirosint  75 MCG Caps Generic drug: Levothyroxine  Sodium Take 1 capsule (75 mcg total) by mouth daily before breakfast. TAKE 1 CAPSULE BY MOUTH ONCE DAILY BEFORE BREAKFAST   Vitamin D3 50 MCG (2000 UT) Tabs Take by mouth daily.         PE: BP 122/70   Pulse 80   Ht 5' 5 (1.651 m)   Wt 157 lb 9.6 oz (71.5 kg)   LMP 05/22/2017   BMI 26.23 kg/m  Wt Readings from Last 3 Encounters:  11/03/23 157 lb 9.6 oz (71.5 kg)  08/01/23 160 lb 3.2 oz (72.7 kg)  01/02/23 169 lb (76.7 kg)     April 06, 2017 labs showed: Free T4 within normal limits at 1.4 2, TSH within normal limits 0.4 - RECENT LABS SHOWING A1C OF 7.1% INCREASING FROM 6.6%.   Recent Results (from the  past 2160 hours)  Lab report - scanned     Status: None   Collection Time: 10/26/23 12:00 AM  Result Value Ref Range   Free T4 1.28     Comment: Abstracted by HIM   TSH 1.74 0.41 - 5.90  Lab report - scanned     Status: None   Collection Time: 10/26/23 12:00 AM  Result Value Ref Range   EGFR 72.0     Comment: Abstracted by HIM  HgB A1c     Status: Abnormal   Collection Time: 11/03/23 11:10 AM  Result Value Ref Range   Hemoglobin A1C     HbA1c POC (<> result, manual entry)     HbA1c, POC (prediabetic range)     HbA1c, POC (controlled diabetic range) 7.5 (A) 0.0 - 7.0 %     ASSESSMENT: 1. 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 2. Type 2 diabetes with A1c increasing to  8.1%  from 6.3% 3.  Hyperlipidemia-controlled  PLAN:  -   Her thyroid  function tests are consistent with appropriate replacement.  She is advised to continue Tirosint  75 mcg p.o. daily before breakfast.    - We discussed about the correct intake of her thyroid  hormone, on empty stomach at fasting, with water, separated by at least 30 minutes from breakfast and other medications,  and separated by more than 4 hours from calcium, iron, multivitamins, acid reflux medications (PPIs). -Patient is made aware of the fact that thyroid  hormone replacement is needed for life, dose to be adjusted by periodic monitoring of thyroid  function tests.   Regarding her controlled type 2 DM:  -- She remains on Jardiance  25 mg p.o. daily at breakfast.  Her point-of-care A1c is 7.5% improving from 8.1% during her last visit.   She did not tolerate metformin.  She would like to avoid further addition of medications. - She dislikes injectable treatment.  If she loses control, she will be considered for low-dose glipizide .    She is encouraged to continue monitoring blood glucose twice a day-daily before breakfast and at bedtime and call clinic for hypoglycemia under 70 and hyperglycemia above  200 milligrams per DL.  - she acknowledges that there is a room for improvement in her food and drink choices. - Suggestion is made for her to avoid simple carbohydrates  from her diet including Cakes, Sweet Desserts, Ice Cream, Soda (diet and regular), Sweet Tea, Candies, Chips, Cookies, Store Bought Juices, Alcohol in Excess of  1-2 drinks a day, Artificial Sweeteners,  Coffee Creamer, and Sugar-free Products, Lemonade. This will help patient to have more stable blood glucose profile and potentially avoid unintended weight gain.   Her blood pressure is controlled at 122/70 mmHg.  She is advised to continue losartan 50 mg daily at breakfast.  Her previsit lipid panel showed controlled LDL at 55.  She is responding and tolerating atorvastatin.  She is advised to continue atorvastatin 20  mg p.o. nightly.   Side effects and precautions discussed with her.  Her screening ABI was negative for PAD in April 2022.  She is advised to maintain close follow-up with her PMD.   I spent  26  minutes in the care of the patient today including review of labs from Thyroid  Function, CMP, and other relevant labs ; imaging/biopsy records (current and previous including abstractions from other facilities); face-to-face time discussing  her lab results and symptoms, medications doses, her options of short and long term treatment based on the latest standards of care / guidelines;  and documenting the encounter.  Medora VEAR Molt  participated in the discussions, expressed understanding, and voiced agreement with the above plans.  All questions were answered to her satisfaction. she is encouraged to contact clinic should she have any questions or concerns prior to her return visit.    Ranny Earl, MD [facility] [address] [place], [place] [address] Ph: [phone] F: [phone]  -  This note was partially dictated with voice recognition software. Similar sounding words can be transcribed inadequately or may not  be corrected upon review.  11/03/2023, 11:46 AM

## 2024-01-03 LAB — LAB REPORT - SCANNED
A1c: 7.5
EGFR: 72
Free T4: 1 ng/dL

## 2024-01-10 ENCOUNTER — Other Ambulatory Visit: Payer: Self-pay | Admitting: "Endocrinology

## 2024-01-10 DIAGNOSIS — E119 Type 2 diabetes mellitus without complications: Secondary | ICD-10-CM

## 2024-01-29 ENCOUNTER — Other Ambulatory Visit: Payer: Self-pay | Admitting: "Endocrinology

## 2024-03-18 ENCOUNTER — Ambulatory Visit: Admitting: Pulmonary Disease

## 2024-03-19 ENCOUNTER — Ambulatory Visit: Admitting: Pulmonary Disease

## 2024-05-03 ENCOUNTER — Ambulatory Visit: Admitting: "Endocrinology
